# Patient Record
Sex: Female | Born: 1970
Health system: Southern US, Community
[De-identification: ages and names within clinical notes are randomized; demographics above are authoritative.]

## PROBLEM LIST (undated history)

## (undated) DIAGNOSIS — N200 Calculus of kidney: Secondary | ICD-10-CM

## (undated) DIAGNOSIS — R519 Headache, unspecified: Secondary | ICD-10-CM

## (undated) DIAGNOSIS — B019 Varicella without complication: Secondary | ICD-10-CM

## (undated) HISTORY — DX: Varicella without complication: B01.9

## (undated) HISTORY — PX: OTHER SURGICAL HISTORY: SHX169

## (undated) HISTORY — DX: Calculus of kidney: N20.0

## (undated) HISTORY — PX: KIDNEY STONE SURGERY: SHX686

---

## 1999-04-08 ENCOUNTER — Encounter: Payer: Self-pay | Admitting: General Surgery

## 1999-04-08 ENCOUNTER — Ambulatory Visit (HOSPITAL_COMMUNITY): Admission: RE | Admit: 1999-04-08 | Discharge: 1999-04-08 | Payer: Self-pay | Admitting: General Surgery

## 1999-04-16 ENCOUNTER — Ambulatory Visit (HOSPITAL_COMMUNITY): Admission: RE | Admit: 1999-04-16 | Discharge: 1999-04-16 | Payer: Self-pay | Admitting: General Surgery

## 1999-04-16 ENCOUNTER — Encounter: Payer: Self-pay | Admitting: General Surgery

## 1999-04-16 ENCOUNTER — Encounter (INDEPENDENT_AMBULATORY_CARE_PROVIDER_SITE_OTHER): Payer: Self-pay | Admitting: Specialist

## 1999-06-04 ENCOUNTER — Ambulatory Visit (HOSPITAL_COMMUNITY): Admission: RE | Admit: 1999-06-04 | Discharge: 1999-06-04 | Payer: Self-pay | Admitting: Internal Medicine

## 1999-06-04 ENCOUNTER — Encounter: Payer: Self-pay | Admitting: Internal Medicine

## 2000-03-11 ENCOUNTER — Encounter: Payer: Self-pay | Admitting: Dermatology

## 2000-03-11 ENCOUNTER — Ambulatory Visit (HOSPITAL_COMMUNITY): Admission: RE | Admit: 2000-03-11 | Discharge: 2000-03-11 | Payer: Self-pay | Admitting: Dermatology

## 2001-05-24 ENCOUNTER — Encounter: Payer: Self-pay | Admitting: Dermatology

## 2001-05-24 ENCOUNTER — Ambulatory Visit (HOSPITAL_COMMUNITY): Admission: RE | Admit: 2001-05-24 | Discharge: 2001-05-24 | Payer: Self-pay | Admitting: Dermatology

## 2002-08-20 ENCOUNTER — Encounter: Payer: Self-pay | Admitting: Internal Medicine

## 2002-08-20 ENCOUNTER — Ambulatory Visit (HOSPITAL_COMMUNITY): Admission: RE | Admit: 2002-08-20 | Discharge: 2002-08-20 | Payer: Self-pay | Admitting: Internal Medicine

## 2004-05-07 ENCOUNTER — Ambulatory Visit: Payer: Self-pay | Admitting: Internal Medicine

## 2004-05-12 ENCOUNTER — Ambulatory Visit: Payer: Self-pay | Admitting: Internal Medicine

## 2007-02-15 ENCOUNTER — Telehealth (INDEPENDENT_AMBULATORY_CARE_PROVIDER_SITE_OTHER): Payer: Self-pay | Admitting: *Deleted

## 2007-08-23 LAB — CONVERTED CEMR LAB: Pap Smear: NORMAL

## 2007-09-27 ENCOUNTER — Ambulatory Visit (HOSPITAL_COMMUNITY): Admission: RE | Admit: 2007-09-27 | Discharge: 2007-09-27 | Payer: Self-pay | Admitting: Dermatology

## 2008-08-01 ENCOUNTER — Ambulatory Visit: Payer: Self-pay | Admitting: Internal Medicine

## 2008-08-01 LAB — CONVERTED CEMR LAB
ALT: 20 units/L (ref 0–35)
AST: 25 units/L (ref 0–37)
Alkaline Phosphatase: 60 units/L (ref 39–117)
BUN: 14 mg/dL (ref 6–23)
Basophils Absolute: 0 10*3/uL (ref 0.0–0.1)
Bilirubin Urine: NEGATIVE
Bilirubin, Direct: 0.2 mg/dL (ref 0.0–0.3)
Calcium: 8.9 mg/dL (ref 8.4–10.5)
Creatinine, Ser: 0.6 mg/dL (ref 0.4–1.2)
Eosinophils Relative: 3.4 % (ref 0.0–5.0)
GFR calc non Af Amer: 119.18 mL/min (ref >60)
Glucose, Bld: 91 mg/dL (ref 70–99)
HCT: 43.4 % (ref 36.0–46.0)
HDL: 44.5 mg/dL (ref >39.00)
Ketones, ur: NEGATIVE mg/dL
Leukocytes, UA: NEGATIVE
Lymphocytes Relative: 20.2 % (ref 12.0–46.0)
Monocytes Relative: 6.9 % (ref 3.0–12.0)
Neutrophils Relative %: 69.3 % (ref 43.0–77.0)
Platelets: 213 10*3/uL (ref 150.0–400.0)
Potassium: 4.2 meq/L (ref 3.5–5.1)
RDW: 12.8 % (ref 11.5–14.6)
Specific Gravity, Urine: 1.015 (ref 1.000–1.030)
TSH: 2.78 microintl units/mL (ref 0.35–5.50)
Total Bilirubin: 1.2 mg/dL (ref 0.3–1.2)
Total Protein, Urine: NEGATIVE mg/dL
Triglycerides: 63 mg/dL (ref 0.0–149.0)
VLDL: 12.6 mg/dL (ref 0.0–40.0)
WBC: 5.1 10*3/uL (ref 4.5–10.5)
pH: 7 (ref 5.0–8.0)

## 2008-08-03 ENCOUNTER — Ambulatory Visit: Payer: Self-pay | Admitting: Internal Medicine

## 2008-08-03 DIAGNOSIS — F411 Generalized anxiety disorder: Secondary | ICD-10-CM | POA: Insufficient documentation

## 2008-08-03 DIAGNOSIS — K219 Gastro-esophageal reflux disease without esophagitis: Secondary | ICD-10-CM | POA: Insufficient documentation

## 2008-08-03 DIAGNOSIS — R1011 Right upper quadrant pain: Secondary | ICD-10-CM | POA: Insufficient documentation

## 2008-08-03 DIAGNOSIS — Z87442 Personal history of urinary calculi: Secondary | ICD-10-CM | POA: Insufficient documentation

## 2008-08-03 DIAGNOSIS — J309 Allergic rhinitis, unspecified: Secondary | ICD-10-CM | POA: Insufficient documentation

## 2008-08-03 DIAGNOSIS — C439 Malignant melanoma of skin, unspecified: Secondary | ICD-10-CM | POA: Insufficient documentation

## 2008-08-03 DIAGNOSIS — F418 Other specified anxiety disorders: Secondary | ICD-10-CM | POA: Insufficient documentation

## 2008-08-03 DIAGNOSIS — M79609 Pain in unspecified limb: Secondary | ICD-10-CM | POA: Insufficient documentation

## 2008-08-03 DIAGNOSIS — E785 Hyperlipidemia, unspecified: Secondary | ICD-10-CM | POA: Insufficient documentation

## 2008-08-07 ENCOUNTER — Encounter (INDEPENDENT_AMBULATORY_CARE_PROVIDER_SITE_OTHER): Payer: Self-pay | Admitting: *Deleted

## 2008-08-09 ENCOUNTER — Encounter: Admission: RE | Admit: 2008-08-09 | Discharge: 2008-08-09 | Payer: Self-pay | Admitting: Internal Medicine

## 2008-11-02 ENCOUNTER — Ambulatory Visit: Payer: Self-pay | Admitting: Internal Medicine

## 2008-11-02 DIAGNOSIS — J019 Acute sinusitis, unspecified: Secondary | ICD-10-CM | POA: Insufficient documentation

## 2009-04-19 ENCOUNTER — Ambulatory Visit: Payer: Self-pay | Admitting: Internal Medicine

## 2009-04-19 DIAGNOSIS — J3501 Chronic tonsillitis: Secondary | ICD-10-CM | POA: Insufficient documentation

## 2009-06-04 ENCOUNTER — Encounter: Payer: Self-pay | Admitting: Internal Medicine

## 2009-08-14 ENCOUNTER — Ambulatory Visit: Payer: Self-pay | Admitting: Internal Medicine

## 2009-08-14 LAB — CONVERTED CEMR LAB
BUN: 17 mg/dL (ref 6–23)
Basophils Absolute: 0 10*3/uL (ref 0.0–0.1)
Bilirubin Urine: NEGATIVE
Bilirubin, Direct: 0.1 mg/dL (ref 0.0–0.3)
Chloride: 106 meq/L (ref 96–112)
Cholesterol: 174 mg/dL (ref 0–200)
Creatinine, Ser: 0.6 mg/dL (ref 0.4–1.2)
Eosinophils Absolute: 0.2 10*3/uL (ref 0.0–0.7)
Glucose, Bld: 89 mg/dL (ref 70–99)
HCT: 42.7 % (ref 36.0–46.0)
LDL Cholesterol: 121 mg/dL — ABNORMAL HIGH (ref 0–99)
Leukocytes, UA: NEGATIVE
Lymphs Abs: 1.4 10*3/uL (ref 0.7–4.0)
MCV: 89.1 fL (ref 78.0–100.0)
Monocytes Absolute: 0.4 10*3/uL (ref 0.1–1.0)
Neutrophils Relative %: 66 % (ref 43.0–77.0)
Nitrite: NEGATIVE
Platelets: 236 10*3/uL (ref 150.0–400.0)
Potassium: 4.6 meq/L (ref 3.5–5.1)
RDW: 14 % (ref 11.5–14.6)
Specific Gravity, Urine: >=1.03 (ref 1.000–1.030)
TSH: 2.34 microintl units/mL (ref 0.35–5.50)
Total Bilirubin: 1 mg/dL (ref 0.3–1.2)
Triglycerides: 72 mg/dL (ref 0.0–149.0)
VLDL: 14.4 mg/dL (ref 0.0–40.0)
pH: 6 (ref 5.0–8.0)

## 2009-08-19 ENCOUNTER — Ambulatory Visit: Payer: Self-pay | Admitting: Internal Medicine

## 2010-02-05 ENCOUNTER — Ambulatory Visit: Payer: Self-pay | Admitting: Internal Medicine

## 2010-02-05 DIAGNOSIS — E669 Obesity, unspecified: Secondary | ICD-10-CM | POA: Insufficient documentation

## 2010-03-25 NOTE — Assessment & Plan Note (Signed)
Summary: throat issues/#/cd   Vital Signs:  Patient profile:   40 year old female Height:      64 inches Weight:      229.50 pounds BMI:     39.54 O2 Sat:      98 % on Room air Temp:     98.8 degrees F oral Pulse rate:   106 / minute BP sitting:   114 / 68  (left arm) Cuff size:   large  Vitals Entered ByZella Ball Ewing (April 19, 2009 10:05 AM)  O2 Flow:  Room air CC: Sore Throat/RE   CC:  Sore Throat/RE.  History of Present Illness: here with light ST that seems to come and go, now into the third episode of hte past 2 months assoc pain this time with loud voice and higher pitches;  more sneezing in the past wk; has sensation in the back of the thraot to swallow this AM - like a "rubbing" sensation;  no fever, colored d/c, no singif cough, Pt denies CP, sob, doe, wheezing, orthopnea, pnd, worsening LE edema, palps, dizziness or syncope   . No recent fever or increased cough.  Pt denies new neuro symptoms such as headache, facial or extremity weakness No polydipsia, polyuria.  No osa syjmtpoms or hypersomnolence.  Problems Prior to Update: 1)  Tonsillitis, Chronic  (ICD-474.00) 2)  Sinusitis- Acute-nos  (ICD-461.9) 3)  Preventive Health Care  (ICD-V70.0) 4)  Ruq Pain  (ICD-789.01) 5)  Hyperlipidemia  (ICD-272.4) 6)  Melanoma  (ICD-172.9) 7)  Heel Pain, Right  (ICD-729.5) 8)  Nephrolithiasis, Hx of  (ICD-V13.01) 9)  Allergic Rhinitis  (ICD-477.9) 10)  Preventive Health Care  (ICD-V70.0) 11)  Depression  (ICD-311) 12)  Anxiety  (ICD-300.00) 13)  Gerd  (ICD-530.81)  Medications Prior to Update: 1)  Amoxicillin-Pot Clavulanate 875-125 Mg Tabs (Amoxicillin-Pot Clavulanate) .Marland Kitchen.. 1 By Mouth Two Times A Day  Current Medications (verified): 1)  Fexofenadine Hcl 180 Mg Tabs (Fexofenadine Hcl) .Marland Kitchen.. 1 By Mouth Once Daily As Needed Allergies 2)  Fluticasone Propionate 50 Mcg/act Susp (Fluticasone Propionate) .... 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   Eyes:  vision grossly intact, pupils equal, and pupils round.   Ears:  bilat tm's mild red, sinus nontender Nose:  nasal dischargemucosal pallor and mucosal edema.   Mouth:  pharynx with midl cobblestoning;  also tonsil 2+ chronic enlarged and crypts with whitish material Neck:  supple and cervical lymphadenopathy.   Lungs:  normal respiratory effort and normal breath sounds.   Heart:  normal rate and regular rhythm.   Extremities:  no edema, no erythema  Psych:  slightly anxious.     Impression & Recommendations:  Problem # 1:  ALLERGIC RHINITIS (ICD-477.9)  most likely to blame for the recurring ST - for depo shot ; then tx for allergies with allegra adn flonase  Her updated medication list for this  problem includes:    Fexofenadine Hcl 180 Mg Tabs (Fexofenadine hcl) .Marland Kitchen... 1 by mouth once daily as needed allergies    Fluticasone Propionate 50 Mcg/act Susp (Fluticasone propionate) .Marland Kitchen... 2 spray/side once daily  Orders: Depo- Medrol 40mg  (J1030) Depo- Medrol 80mg  (J1040) Admin of Therapeutic Inj  intramuscular or subcutaneous (16109)  Problem # 2:  TONSILLITIS, CHRONIC (ICD-474.00) consider ent, but pt wants to try tx above first  Complete Medication List: 1)  Fexofenadine Hcl 180 Mg Tabs (Fexofenadine hcl)  .Marland Kitchen.. 1 by mouth once daily as needed allergies 2)  Fluticasone Propionate 50 Mcg/act Susp (Fluticasone propionate) .... 2 spray/side once daily  Patient Instructions: 1)  you had the steroid shot today 2)  Please take all new medications as prescribed 3)  Continue all previous medications as before this visit  4)  Please schedule a follow-up appointment in 4 months with CPX labs Prescriptions: FLUTICASONE PROPIONATE 50 MCG/ACT SUSP (FLUTICASONE PROPIONATE) 2 spray/side once daily  #1 x 11   Entered and Authorized by:   Corwin Levins MD   Signed by:   Corwin Levins MD on 04/19/2009   Method used:   Print then Give to Patient   RxID:   [id number] FEXOFENADINE HCL 180 MG TABS (FEXOFENADINE HCL) 1 by mouth once daily as needed allergies  #30 x 11   Entered and Authorized by:   Corwin Levins MD   Signed by:   Corwin Levins MD on 04/19/2009   Method used:   Print then Give to Patient   RxID:   [id number]    Medication Administration  Injection # 1:    Medication: Depo- Medrol 40mg     Diagnosis: ALLERGIC RHINITIS (ICD-477.9)    Route: IM    Site: RUOQ gluteus    Exp Date: 11/2009    Lot #: [id number]    Mfr: Teva    Given by: Zella Ball Ewing (April 19, 2009 10:44 AM)  Injection # 2:    Medication: Depo- Medrol 80mg     Diagnosis: ALLERGIC RHINITIS (ICD-477.9)    Route: IM    Site: RUOQ gluteus    Exp Date: 11/2009    Lot #: [id number]    Mfr: Teva    Given by: Zella Ball Ewing (April 19, 2009 10:44 AM)  Orders Added: 1)  Depo- Medrol 40mg  [J1030] 2)  Depo- Medrol 80mg  [J1040] 3)  Admin of Therapeutic Inj  intramuscular or subcutaneous [96372] 4)  Est. Patient Level IV [25366]

## 2010-03-25 NOTE — Assessment & Plan Note (Signed)
Summary: 4 MTH FU--STC   Vital Signs:  Patient profile:   40 year old female Height:      63 inches Weight:      231.50 pounds BMI:     41.16 O2 Sat:      97 % on Room air Temp:     99 degrees F oral Pulse rate:   87 / minute BP sitting:   104 / 70  (left arm) Cuff size:   large  Vitals Entered By: Zella Ball Ewing CMA (AAMA) (August 19, 2009 8:14 AM)  O2 Flow:  Room air  CC: 4 month followup/RE   CC:  4 month followup/RE.  History of Present Illness: overall doing ok, but has recurreing reflux, and occasional lump in the throat feeling for which she saw ENT;  did not take PPI as recommended.  No other dysphagia, n/v, abd pain, bowel change or blood.    Problems Prior to Update: 1)  Tonsillitis, Chronic  (ICD-474.00) 2)  Sinusitis- Acute-nos  (ICD-461.9) 3)  Preventive Health Care  (ICD-V70.0) 4)  Ruq Pain  (ICD-789.01) 5)  Hyperlipidemia  (ICD-272.4) 6)  Melanoma  (ICD-172.9) 7)  Heel Pain, Right  (ICD-729.5) 8)  Nephrolithiasis, Hx of  (ICD-V13.01) 9)  Allergic Rhinitis  (ICD-477.9) 10)  Preventive Health Care  (ICD-V70.0) 11)  Depression  (ICD-311) 12)  Anxiety  (ICD-300.00) 13)  Gerd  (ICD-530.81)  Medications Prior to Update: 1)  Fexofenadine Hcl 180 Mg Tabs (Fexofenadine Hcl) .Marland Kitchen.. 1 By Mouth Once Daily As Needed Allergies 2)  Fluticasone Propionate 50 Mcg/act Susp (Fluticasone Propionate) .... 2 Spray/side Once Daily  Current Medications (verified): 1)  Fexofenadine Hcl 180 Mg Tabs (Fexofenadine Hcl) .Marland Kitchen.. 1 By Mouth Once Daily As Needed Allergies 2)  Omeprazole 20 Mg Cpdr (Omeprazole) .... 2 By Mouth Once Daily  Allergies (verified): No Known Drug Allergies  Past History:  Past Medical History: Last updated: 08/03/2008 melanoma  - 2001 LLE GERD Anxiety insomnia migraine Depression Allergic rhinitis Nephrolithiasis, hx of Hyperlipidemia  Past Surgical History: Last updated: 08/03/2008 Denies surgical history  Family History: Last updated:  08/03/2008 heart disease - grandmother, as well as DM, lung cancer, TIA mother with gallstones father with basal cell ca, elevated cholesterol maternal grandparents - both with DM grandfather with heart disease  Social History: Last updated: 08/03/2008 Alcohol use-yes - rare Never Smoked Married no children work - at home office, 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         all otherwise negative per pt -    Physical Exam  General:  alert and overweight-appearing.   Head:  normocephalic and atraumatic.   Eyes:  vision grossly intact, pupils equal, and pupils round.   Ears:  R ear normal and L ear normal.   Nose:  no external deformity and no nasal discharge.   Mouth:  no gingival abnormalities and pharynx pink and moist.   Neck:  supple and no masses.   Lungs:  normal respiratory effort and normal breath sounds.   Heart:  normal rate and regular rhythm.   Abdomen:  soft, non-tender, and normal bowel sounds.   Msk:  no joint tenderness  and no joint swelling.   Extremities:  no edema, no erythema  Neurologic:  cranial nerves II-XII intact and strength normal in all extremities.   Skin:  color normal and no rashes.   Psych:  not depressed appearing and slightly anxious.     Impression & Recommendations:  Problem # 1:  Preventive Health Care (ICD-V70.0) Overall doing well, age appropriate education and counseling updated and referral for appropriate preventive services  done unless declined, immunizations up to date or declined, diet counseling done if overweight, urged to quit smoking if smokes , most recent labs reviewed and current ordered if appropriate, ecg reviewed or declined (interpretation per ECG scanned in the EMR if done); information regarding Medicare Prevention requirements given if appropriate; speciality referrals updated as appropriate   Problem # 2:  GERD (ICD-530.81)  Her updated medication list for this problem includes:    Omeprazole 20 Mg Cpdr (Omeprazole) .Marland Kitchen... 2 by mouth once daily treat as above, f/u any worsening signs or symptoms   Problem # 3:  ANXIETY (ICD-300.00) stable overall by hx and exam, ok to continue meds/tx as is   Complete Medication List: 1)  Fexofenadine Hcl 180 Mg Tabs (Fexofenadine hcl) .Marland Kitchen.. 1 by mouth once daily as needed allergies 2)  Omeprazole 20 Mg Cpdr (Omeprazole) .... 2 by mouth once daily  Patient Instructions: 1)  please call for baseline mammogram  - consider [facility] on [address] 2)  Please take all new medications as prescribed 3)  Continue all previous medications as before this visit  4)  please call for referral for GI if still have symptoms swallowing in 4 to 6 wks, or sooner if worse 5)  Please schedule a follow-up appointment in 1 year or sooner if needed Prescriptions: OMEPRAZOLE 20 MG CPDR (OMEPRAZOLE) 2 by mouth once daily  #180 x 3   Entered and Authorized by:   Corwin Levins MD   Signed by:   Corwin Levins MD on 08/19/2009   Method used:   Print then Give to Patient   RxID:   [id number]

## 2010-03-25 NOTE — Consult Note (Signed)
Summary: [facility]  [facility]   Imported By: Lanelle Bal 06/07/2009 11:28:57  _____________________________________________________________________  External Attachment:    Type:   Image     Comment:   External Document

## 2010-03-27 NOTE — Assessment & Plan Note (Signed)
Summary: follow up weight loss-lb   Vital Signs:  Patient profile:   40 year old female Height:      63 inches Weight:      238 pounds BMI:     42.31 O2 Sat:      97 % on Room air Temp:     98.8 degrees F oral Pulse rate:   86 / minute BP sitting:   112 / 70  (left arm) Cuff size:   large  Vitals Entered By: Zella Ball Ewing CMA Duncan Dull) (February 05, 2010 8:09 AM)  O2 Flow:  Room air CC: Weight Loss, congestion/RE   CC:  Weight Loss and congestion/RE.  History of Present Illness: here to f/u; Overall good compliance with meds, and good tolerability, but still with daily breakthrough reflux symtpoms, without n/v, dysphagia, abd pain, bowel change, or blood.  Also with several wks ongoing nasal allergy symtpoms with clearish drainage , post nasal gtt,  slight cough, but no fever, pain, colored d/c, prod cough and Pt denies CP, worsening sob, doe, wheezing, orthopnea, pnd, worsening LE edema, palps, dizziness or syncope  Pt denies new neuro symptoms such as headache, facial or extremity weakness  Pt denies polydipsia, polyuria.   Pt denies polydipsia, polyuria,  trying to follow low chol diet.   Currently out of her ongoing meds. No fever, wt loss, night sweats, loss of appetite or other constitutional symptoms  Denies worsening depressive symptoms, suicidal ideation, or panic., but unable to lose wt, keeps gaining about 10 lbs per yr, now decides she has to make lifestyle change , husband has sleep apnea and she has snoring, but no am headache or daytime somnolence;.    Preventive Screening-Counseling & Management      Drug Use:  no.    Problems Prior to Update: 1)  Obesity  (ICD-278.00) 2)  Tonsillitis, Chronic  (ICD-474.00) 3)  Sinusitis- Acute-nos  (ICD-461.9) 4)  Preventive Health Care  (ICD-V70.0) 5)  Ruq Pain  (ICD-789.01) 6)  Hyperlipidemia  (ICD-272.4) 7)  Melanoma  (ICD-172.9) 8)  Heel Pain, Right  (ICD-729.5) 9)  Nephrolithiasis, Hx of  (ICD-V13.01) 10)  Allergic Rhinitis   (ICD-477.9) 11)  Preventive Health Care  (ICD-V70.0) 12)  Depression  (ICD-311) 13)  Anxiety  (ICD-300.00) 14)  Gerd  (ICD-530.81)  Medications Prior to Update: 1)  Fexofenadine Hcl 180 Mg Tabs (Fexofenadine Hcl) .Marland Kitchen.. 1 By Mouth Once Daily As Needed Allergies 2)  Omeprazole 20 Mg Cpdr (Omeprazole) .... 2 By Mouth Once Daily  Current Medications (verified): 1)  Fexofenadine Hcl 180 Mg Tabs (Fexofenadine Hcl) .Marland Kitchen.. 1 By Mouth Once Daily As Needed Allergies 2)  Dexilant 60 Mg Cpdr (Dexlansoprazole) .Marland Kitchen.. 1 Po Once Daily 3)  Phentermine Hcl 37.5 Mg Caps (Phentermine Hcl) .Marland Kitchen.. 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   Head:  normocephalic and atraumatic.   Eyes:  vision grossly intact, pupils equal, and pupils round.   Ears:  R ear normal and L ear normal.   Nose:  nasal dischargemucosal pallor and mucosal edema.   Mouth:  pharyngeal erythema and fair dentition.   Neck:  supple and no masses.   Lungs:  normal respiratory effort and normal breath sounds.   Heart:  normal rate and regular rhythm.   Abdomen:  soft, non-tender, and normal bowel sounds.   Extremities:   no edema, no erythema    Impression & Recommendations:  Problem # 1:  GERD (ICD-530.81)  Her updated medication list for this problem includes:    Dexilant 60 Mg Cpdr (Dexlansoprazole) .Marland Kitchen... 1 po once daily ? increased pain to omeprazole per pt;  try change to dexilant 60mg  per day  Problem # 2:  HYPERLIPIDEMIA (ICD-272.4)  Labs Reviewed: SGOT: 17 (08/14/2009)   SGPT: 20 (08/14/2009)   HDL:38.30 (08/14/2009), 44.50 (08/01/2008)  LDL:121 (08/14/2009)  Chol:174 (08/14/2009), 201 (08/01/2008)  Trig:72.0 (08/14/2009), 63.0 (08/01/2008) d/w pt - declines statin at this time,  Pt to continue diet efforts, good med tolerance; to check labs next visit- goal LDL less than 100  Problem # 3:  ALLERGIC RHINITIS (ICD-477.9)  Her updated medication list for this problem includes:    Fexofenadine Hcl 180 Mg Tabs (Fexofenadine hcl) .Marland Kitchen... 1 by mouth once daily as needed allergies treat as above, f/u any worsening signs or symptoms   Problem # 4:  OBESITY (ICD-278.00) unable to lose wt;  counseled on increased excercise, lower calories, and limited phentermine rx   Complete Medication List: 1)  Fexofenadine Hcl 180 Mg Tabs (Fexofenadine hcl) .Marland Kitchen.. 1 by mouth once daily as needed allergies 2)  Dexilant 60 Mg Cpdr (Dexlansoprazole) .Marland Kitchen.. 1 po once daily 3)  Phentermine Hcl 37.5 Mg Caps (Phentermine hcl) .Marland Kitchen.. 1 by mouth once daily  Patient Instructions: 1)  Please take all new medications as prescribed 2)  Continue all previous medications as before this visit  3)  Please schedule a follow-up appointment in June 2012 for CPX with labs Prescriptions: PHENTERMINE HCL 37.5 MG CAPS (PHENTERMINE HCL) 1 by mouth once daily  #30 x 2   Entered and Authorized by:   Corwin Levins MD   Signed by:   Corwin Levins MD on 02/05/2010   Method used:   Print then Give to Patient   RxID:   [id number] FEXOFENADINE HCL 180 MG TABS (FEXOFENADINE HCL) 1 by mouth once daily as needed allergies  #30 x 11   Entered  and Authorized by:   Corwin Levins MD   Signed by:   Corwin Levins MD on 02/05/2010   Method used:   Print then Give to Patient   RxID:   [id number] DEXILANT 60 MG CPDR (DEXLANSOPRAZOLE) 1 po once daily  #30 x 11   Entered and Authorized by:   Corwin Levins MD   Signed by:   Corwin Levins MD on 02/05/2010   Method used:   Print then Give to Patient   RxID:   [id number]    Orders Added: 1)  Est. Patient Level IV [47425]

## 2010-07-11 NOTE — Op Note (Signed)
[place]. [facility]  Patient:    Darlene Lopez, Darlene Lopez                      MRN: [id number] Proc. Date: 04/16/99 Adm. Date:  [id number] Disc. Date: [id number] Attending:  Janalyn Rouse                           Operative Report  PREOPERATIVE DIAGNOSIS:  Superficial spreading melanoma of the left anterior thigh.  POSTOPERATIVE DIAGNOSIS:  Superficial spreading melanoma of the left anterior thigh.  OPERATION PERFORMED: 1. Injection of blue dye. 2. Lymphatic mapping of the left leg. 3. Left inguinal sentinel lymph node biopsy. 4. Wide excision of melanoma of the left anterior thigh with layered closure.  SURGEON:  Rose Phi. Maple Hudson, M.D.  ANESTHESIA:  General.  DESCRIPTION OF PROCEDURE:  After suitable general anesthesia was induced, the patient was placed in supine position.  I then injected, intradermally, Lymphazurin blue around the previous melanoma excisional biopsy site.  I then prepped the thigh and groin and then draped this out.  With a hand held gamma probe, we carefully mapped the inguinal area and identified, right at the  level of the inguinal crease, a very hot spot and then just below that another ot spot.  A vertical incision was then made over these areas and we dissected through the  subcutaneous tissues, inserted a self-retaining retractor.  I was then able to identify the very hot spot and carefully dissected out a very blue and quite hot lymph node and we clipped the lymphatics and removed it.  Just inferior to that was another bluish and warmish lymph node which we excised also.  These were submitted as sentinel lymph nodes.  Careful scanning of the rest of the inguinal area revealed no other hot spots.  This incision was closed in layers with 3-0 Vicryl and then interrupted 4-0 nylons for the skin.  On the previous incision site on the left anterior thigh, I marked a 1.5 cm margin around the old scar.  We then excised  this down to the anterior fascia. Hemostasis obtained with the cautery.  We then closed the incision in layers with 3-0 Vicryl and 4-0 nylon for the skin.  Dressing applied.  The patient was transferred to he recovery room in satisfactory condition having tolerated the procedure well. DD:  04/16/99 TD:  04/16/99 Job: [id number]

## 2014-04-04 ENCOUNTER — Encounter: Payer: Self-pay | Admitting: Family

## 2014-04-04 ENCOUNTER — Ambulatory Visit (INDEPENDENT_AMBULATORY_CARE_PROVIDER_SITE_OTHER): Payer: BLUE CROSS/BLUE SHIELD | Admitting: Family

## 2014-04-04 VITALS — BP 144/90 | HR 109 | Temp 98.2°F | Resp 18 | Ht 63.0 in | Wt 249.1 lb

## 2014-04-04 DIAGNOSIS — R21 Rash and other nonspecific skin eruption: Secondary | ICD-10-CM

## 2014-04-04 MED ORDER — TRIAMCINOLONE ACETONIDE 0.1 % EX CREA: 1.0000 "application " | TOPICAL_CREAM | Freq: Two times a day (BID) | CUTANEOUS | Status: DC

## 2014-04-04 MED ORDER — VALACYCLOVIR HCL 1 G PO TABS: 1000.0000 mg | ORAL_TABLET | Freq: Three times a day (TID) | ORAL | Status: DC

## 2014-04-04 NOTE — Assessment & Plan Note (Signed)
Pattern of rash consistent with early shingles, however cannot rule out allergic reaction. Start Valtrex. Start triamcinolone cream as needed for itching. Apply cool compresses as needed. Follow up if symptoms worsen or fail to improve.

## 2014-04-04 NOTE — Patient Instructions (Signed)
Thank you for choosing [facility].  Summary/Instructions:  Your prescription(s) have been submitted to your pharmacy or been printed and provided for you. Please take as directed and contact our office if you believe you are having problem(s) with the medication(s) or have any questions.  If your symptoms worsen or fail to improve, please contact our office for further instruction, or in case of emergency go directly to the emergency room at the closest medical facility.   Shingles Shingles (herpes zoster) is an infection that is caused by the same virus that causes chickenpox (varicella). The infection causes a painful skin rash and fluid-filled blisters, which eventually break open, crust over, and heal. It may occur in any area of the body, but it usually affects only one side of the body or face. The pain of shingles usually lasts about 1 month. However, some people with shingles may develop long-term (chronic) pain in the affected area of the body. Shingles often occurs many years after the person had chickenpox. It is more common:  In people older than 50 years.  In people with weakened immune systems, such as those with HIV, AIDS, or cancer.  In people taking medicines that weaken the immune system, such as transplant medicines.  In people under great stress. CAUSES  Shingles is caused by the varicella zoster virus (VZV), which also causes chickenpox. After a person is infected with the virus, it can remain in the person's body for years in an inactive state (dormant). To cause shingles, the virus reactivates and breaks out as an infection in a nerve root. The virus can be spread from person to person (contagious) through contact with open blisters of the shingles rash. It will only spread to people who have not had chickenpox. When these people are exposed to the virus, they may develop chickenpox. They will not develop shingles. Once the blisters scab over, the person is no  longer contagious and cannot spread the virus to others. SIGNS AND SYMPTOMS  Shingles shows up in stages. The initial symptoms may be pain, itching, and tingling in an area of the skin. This pain is usually described as burning, stabbing, or throbbing.In a few days or weeks, a painful red rash will appear in the area where the pain, itching, and tingling were felt. The rash is usually on one side of the body in a band or belt-like pattern. Then, the rash usually turns into fluid-filled blisters. They will scab over and dry up in approximately 2-3 weeks. Flu-like symptoms may also occur with the initial symptoms, the rash, or the blisters. These may include:  Fever.  Chills.  Headache.  Upset stomach. DIAGNOSIS  Your health care provider will perform a skin exam to diagnose shingles. Skin scrapings or fluid samples may also be taken from the blisters. This sample will be examined under a microscope or sent to a lab for further testing. TREATMENT  There is no specific cure for shingles. Your health care provider will likely prescribe medicines to help you manage the pain, recover faster, and avoid long-term problems. This may include antiviral drugs, anti-inflammatory drugs, and pain medicines. HOME CARE INSTRUCTIONS   Take a cool bath or apply cool compresses to the area of the rash or blisters as directed. This may help with the pain and itching.   Take medicines only as directed by your health care provider.   Rest as directed by your health care provider.  Keep your rash and blisters clean with mild soap and  cool water or as directed by your health care provider.  Do not pick your blisters or scratch your rash. Apply an anti-itch cream or numbing creams to the affected area as directed by your health care provider.  Keep your shingles rash covered with a loose bandage (dressing).  Avoid skin contact with:  Babies.   Pregnant women.   Children with eczema.   Elderly  people with transplants.   People with chronic illnesses, such as leukemia or AIDS.   Wear loose-fitting clothing to help ease the pain of material rubbing against the rash.  Keep all follow-up visits as directed by your health care provider.If the area involved is on your face, you may receive a referral for a specialist, such as an eye doctor (ophthalmologist) or an ear, nose, and throat (ENT) doctor. Keeping all follow-up visits will help you avoid eye problems, chronic pain, or disability.  SEEK IMMEDIATE MEDICAL CARE IF:   You have facial pain, pain around the eye area, or loss of feeling on one side of your face.  You have ear pain or ringing in your ear.  You have loss of taste.  Your pain is not relieved with prescribed medicines.   Your redness or swelling spreads.   You have more pain and swelling.  Your condition is worsening or has changed.   You have a fever. MAKE SURE YOU:  Understand these instructions.  Will watch your condition.  Will get help right away if you are not doing well or get worse. Document Released: 02/09/2005 Document Revised: 06/26/2013 Document Reviewed: 09/24/2011 [facility] Patient Information 2015 [place], [place]. This information is not intended to replace advice given to you by your health care provider. Make sure you discuss any questions you have with your health care provider.

## 2014-04-04 NOTE — Progress Notes (Signed)
   Subjective:    Patient ID: Darlene Lopez, female    DOB: 1970/09/25, 44 y.o.   MRN: [id number]  Chief Complaint  Patient presents with  . Rash    x2 days, itching, no pain, red     HPI:  Darlene Lopez is a 44 y.o. female who presents today for an acute visit.   This is a new problem. Acute symptom of red, itchy rash located on her right flank has been going on for 2 days. Has tried hydrocortisone cream and tea tree oil which provided minimal relief. Intensity of pain is 0/10. Denies any changes to creams, lotions, soaps, or beauty/skin products.   No Known Allergies  No current outpatient prescriptions on file prior to visit.   No current facility-administered medications on file prior to visit.    Past Medical History  Diagnosis Date  . Chicken pox   . Kidney stones     Past Surgical History  Procedure Laterality Date  . Melanoma removal      Family History  Problem Relation Age of Onset  . Prostate cancer Father   . Lung cancer Paternal Grandmother     History   Social History  . Marital Status: Married    Spouse Name: N/A  . Number of Children: 0  . Years of Education: 12   Occupational History  . Secretary    Social History Main Topics  . Smoking status: Never Smoker   . Smokeless tobacco: Never Used  . Alcohol Use: No  . Drug Use: No  . Sexual Activity: Not on file   Other Topics Concern  . Not on file   Social History Narrative  . No narrative on file    Review of Systems  Constitutional: Negative for fever and chills.  Skin: Positive for rash.      Objective:    BP 144/90 mmHg  Pulse 109  Temp(Src) 98.2 F (36.8 C) (Oral)  Resp 18  Ht 5\' 3"  (1.6 m)  Wt 249 lb 1.9 oz (113 kg)  BMI 44.14 kg/m2  SpO2 97% Nursing note and vital signs reviewed.  Physical Exam  Constitutional: She is oriented to person, place, and time. She appears well-developed and well-nourished. No distress.  Cardiovascular: Normal rate, regular  rhythm, normal heart sounds and intact distal pulses.   Pulmonary/Chest: Effort normal and breath sounds normal.  Neurological: She is alert and oriented to person, place, and time.  Skin: Skin is warm and dry. Rash noted. Rash is maculopapular (following dermatomal pattern, appears red). Rash is not vesicular.  Psychiatric: She has a normal mood and affect. Her behavior is normal. Judgment and thought content normal.       Assessment & Plan:

## 2014-04-04 NOTE — Progress Notes (Signed)
Pre visit review using our clinic review tool, if applicable. No additional management support is needed unless otherwise documented below in the visit note. 

## 2016-03-20 DIAGNOSIS — J111 Influenza due to unidentified influenza virus with other respiratory manifestations: Secondary | ICD-10-CM | POA: Diagnosis not present

## 2016-10-02 ENCOUNTER — Telehealth: Payer: Self-pay | Admitting: Internal Medicine

## 2016-10-02 DIAGNOSIS — R55 Syncope and collapse: Secondary | ICD-10-CM | POA: Diagnosis not present

## 2016-10-02 NOTE — Telephone Encounter (Signed)
Ok with me 

## 2016-10-02 NOTE — Telephone Encounter (Signed)
Patient has not been seen by Dr. Jenny Reichmann since 2010.  Would like to know if she can establish again.

## 2016-10-05 NOTE — Telephone Encounter (Signed)
I have scheduled patient.  

## 2016-10-08 ENCOUNTER — Encounter: Payer: Self-pay | Admitting: Internal Medicine

## 2016-10-08 ENCOUNTER — Ambulatory Visit (INDEPENDENT_AMBULATORY_CARE_PROVIDER_SITE_OTHER): Payer: BLUE CROSS/BLUE SHIELD | Admitting: Internal Medicine

## 2016-10-08 ENCOUNTER — Other Ambulatory Visit (INDEPENDENT_AMBULATORY_CARE_PROVIDER_SITE_OTHER): Payer: BLUE CROSS/BLUE SHIELD

## 2016-10-08 VITALS — BP 116/82 | HR 83 | Temp 98.4°F | Ht 63.0 in | Wt 246.0 lb

## 2016-10-08 DIAGNOSIS — R739 Hyperglycemia, unspecified: Secondary | ICD-10-CM | POA: Diagnosis not present

## 2016-10-08 DIAGNOSIS — Z0000 Encounter for general adult medical examination without abnormal findings: Secondary | ICD-10-CM | POA: Diagnosis not present

## 2016-10-08 DIAGNOSIS — Z0001 Encounter for general adult medical examination with abnormal findings: Secondary | ICD-10-CM | POA: Insufficient documentation

## 2016-10-08 DIAGNOSIS — Z114 Encounter for screening for human immunodeficiency virus [HIV]: Secondary | ICD-10-CM | POA: Diagnosis not present

## 2016-10-08 DIAGNOSIS — Z23 Encounter for immunization: Secondary | ICD-10-CM

## 2016-10-08 LAB — BASIC METABOLIC PANEL
BUN: 13 mg/dL (ref 6–23)
CHLORIDE: 102 meq/L (ref 96–112)
CO2: 29 meq/L (ref 19–32)
Calcium: 9.4 mg/dL (ref 8.4–10.5)
Creatinine, Ser: 0.59 mg/dL (ref 0.40–1.20)
GFR: 116.75 mL/min (ref >60.00)
Glucose, Bld: 96 mg/dL (ref 70–99)
POTASSIUM: 4.3 meq/L (ref 3.5–5.1)
SODIUM: 139 meq/L (ref 135–145)

## 2016-10-08 LAB — LIPID PANEL
CHOLESTEROL: 182 mg/dL (ref 0–200)
HDL: 45.3 mg/dL (ref >39.00)
LDL Cholesterol: 120 mg/dL — ABNORMAL HIGH (ref 0–99)
NONHDL: 136.97
Total CHOL/HDL Ratio: 4
Triglycerides: 85 mg/dL (ref 0.0–149.0)
VLDL: 17 mg/dL (ref 0.0–40.0)

## 2016-10-08 LAB — CBC WITH DIFFERENTIAL/PLATELET
BASOS ABS: 0.1 10*3/uL (ref 0.0–0.1)
BASOS PCT: 1.1 % (ref 0.0–3.0)
EOS PCT: 1.8 % (ref 0.0–5.0)
Eosinophils Absolute: 0.2 10*3/uL (ref 0.0–0.7)
HCT: 46.3 % — ABNORMAL HIGH (ref 36.0–46.0)
HEMOGLOBIN: 15.2 g/dL — AB (ref 12.0–15.0)
LYMPHS PCT: 18.3 % (ref 12.0–46.0)
Lymphs Abs: 1.5 10*3/uL (ref 0.7–4.0)
MCHC: 32.8 g/dL (ref 30.0–36.0)
MCV: 89.4 fl (ref 78.0–100.0)
Monocytes Absolute: 0.6 10*3/uL (ref 0.1–1.0)
Monocytes Relative: 6.5 % (ref 3.0–12.0)
Neutro Abs: 6.1 10*3/uL (ref 1.4–7.7)
Neutrophils Relative %: 72.3 % (ref 43.0–77.0)
Platelets: 254 10*3/uL (ref 150.0–400.0)
RBC: 5.18 Mil/uL — AB (ref 3.87–5.11)
RDW: 14.3 % (ref 11.5–15.5)
WBC: 8.5 10*3/uL (ref 4.0–10.5)

## 2016-10-08 LAB — URINALYSIS, ROUTINE W REFLEX MICROSCOPIC
BILIRUBIN URINE: NEGATIVE
HGB URINE DIPSTICK: NEGATIVE
Ketones, ur: NEGATIVE
LEUKOCYTES UA: NEGATIVE
NITRITE: NEGATIVE
RBC / HPF: NONE SEEN (ref 0–?)
Specific Gravity, Urine: <=1.005 — AB (ref 1.000–1.030)
Total Protein, Urine: NEGATIVE
URINE GLUCOSE: NEGATIVE
Urobilinogen, UA: 0.2 (ref 0.0–1.0)
WBC UA: NONE SEEN (ref 0–?)
pH: 6 (ref 5.0–8.0)

## 2016-10-08 LAB — HEPATIC FUNCTION PANEL
ALT: 20 U/L (ref 0–35)
AST: 17 U/L (ref 0–37)
Albumin: 4.1 g/dL (ref 3.5–5.2)
Alkaline Phosphatase: 54 U/L (ref 39–117)
BILIRUBIN TOTAL: 1.1 mg/dL (ref 0.2–1.2)
Bilirubin, Direct: 0.2 mg/dL (ref 0.0–0.3)
Total Protein: 6.9 g/dL (ref 6.0–8.3)

## 2016-10-08 LAB — TSH: TSH: 3.24 u[IU]/mL (ref 0.35–4.50)

## 2016-10-08 LAB — HEMOGLOBIN A1C: HEMOGLOBIN A1C: 5.6 % (ref 4.6–6.5)

## 2016-10-08 NOTE — Assessment & Plan Note (Signed)

## 2016-10-08 NOTE — Patient Instructions (Addendum)

## 2016-10-08 NOTE — Progress Notes (Signed)
Subjective:    Patient ID: Darlene Lopez, female    DOB: 1970-04-13, 46 y.o.   MRN: [id number]  HPI  Here for wellness and f/u;  Overall doing ok;  Pt denies Chest pain, worsening SOB, DOE, wheezing, orthopnea, PND, worsening LE edema, palpitations,  or syncope.  Pt denies neurological change such as new headache, facial or extremity weakness.  Pt denies polydipsia, polyuria, or low sugar symptoms. Pt states overall good compliance with treatment and medications, good tolerability, and has been trying to follow appropriate diet.  Pt denies worsening depressive symptoms, suicidal ideation or panic. Marland Kitchen  Pt states good ability with ADL's, has low fall risk, home safety reviewed and adequate, no other significant changes in hearing or vision, and only occasionally active with exercise.  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  Had to pull over driving one time, husband took her to UC but ECG and lab work OK per pt.   Pt denies fever, wt loss, night sweats, loss of appetite, or other constitutional symptoms Past Medical History:  Diagnosis Date  . Chicken pox   . Kidney stones    Past Surgical History:  Procedure Laterality Date  . melanoma removal      reports that she has never smoked. She has never used smokeless tobacco. She reports that she does not drink alcohol or use drugs. family history includes Lung cancer in her paternal grandmother; Prostate cancer in her father. No Known Allergies Current Outpatient Prescriptions on File Prior to Visit  Medication Sig Dispense Refill  . triamcinolone cream (KENALOG) 0.1 % Apply 1 application topically 2 (two) times daily. 30 g 0  . valACYclovir (VALTREX) 1000 MG tablet Take 1 tablet (1,000 mg total) by mouth 3 (three) times daily. 21 tablet 0   No current facility-administered  medications on file prior to visit.    Review of Systems  Constitutional: Negative for other unusual diaphoresis, sweats, appetite or weight changes HENT: Negative for other worsening hearing loss, ear pain, facial swelling, mouth sores or neck stiffness.   Eyes: Negative for other worsening pain, redness or other visual disturbance.  Respiratory: Negative for other stridor or swelling Cardiovascular: Negative for other palpitations or other chest pain  Gastrointestinal: Negative for worsening diarrhea or loose stools, blood in stool, distention or other pain Genitourinary: Negative for hematuria, flank pain or other change in urine volume.  Musculoskeletal: Negative for myalgias or other joint swelling.  Skin: Negative for other color change, or other wound or worsening drainage.  Neurological: Negative for other syncope or numbness. Hematological: Negative for other adenopathy or swelling Psychiatric/Behavioral: Negative for hallucinations, other worsening agitation, SI, self-injury, or new decreased concentration All other system neg per pt    Objective:   Physical Exam BP 116/82   Pulse 83   Temp 98.4 F (36.9 C)   Ht 5\' 3"  (1.6 m)   Wt 246 lb (111.6 kg)   SpO2 97%   BMI 43.58 kg/m  VS noted,  Constitutional: Pt is oriented to person, place, and time. Appears well-developed and well-nourished, in no significant distress and comfortable Head: Normocephalic and atraumatic  Eyes: Conjunctivae and EOM are normal. Pupils are equal, round, and reactive to light Right Ear: External ear normal without discharge Left Ear: External ear normal without discharge Nose: Nose without discharge or deformity Mouth/Throat:  Oropharynx is without other ulcerations and moist  Neck: Normal range of motion. Neck supple. No JVD present. No tracheal deviation present or significant neck LA or mass Cardiovascular: Normal rate, regular rhythm, normal heart sounds and intact distal pulses.     Pulmonary/Chest: WOB normal and breath sounds without rales or wheezing  Abdominal: Soft. Bowel sounds are normal. NT. No HSM  Musculoskeletal: Normal range of motion. Exhibits no edema Lymphadenopathy: Has no other cervical adenopathy.  Neurological: Pt is alert and oriented to person, place, and time. Pt has normal reflexes. No cranial nerve deficit. Motor grossly intact, Gait intact Skin: Skin is warm and dry. No rash noted or new ulcerations Psychiatric:  Has normal mood and affect. Behavior is normal without agitation No other exam findings    Assessment & Plan:

## 2016-10-09 LAB — HIV ANTIBODY (ROUTINE TESTING W REFLEX): HIV: NONREACTIVE

## 2016-10-20 DIAGNOSIS — H04123 Dry eye syndrome of bilateral lacrimal glands: Secondary | ICD-10-CM | POA: Diagnosis not present

## 2016-10-20 DIAGNOSIS — H524 Presbyopia: Secondary | ICD-10-CM | POA: Diagnosis not present

## 2016-10-20 DIAGNOSIS — H5319 Other subjective visual disturbances: Secondary | ICD-10-CM | POA: Diagnosis not present

## 2016-11-14 DIAGNOSIS — J0100 Acute maxillary sinusitis, unspecified: Secondary | ICD-10-CM | POA: Diagnosis not present

## 2016-12-14 DIAGNOSIS — Z1231 Encounter for screening mammogram for malignant neoplasm of breast: Secondary | ICD-10-CM | POA: Diagnosis not present

## 2016-12-28 DIAGNOSIS — F321 Major depressive disorder, single episode, moderate: Secondary | ICD-10-CM | POA: Diagnosis not present

## 2016-12-28 DIAGNOSIS — F419 Anxiety disorder, unspecified: Secondary | ICD-10-CM | POA: Diagnosis not present

## 2017-02-02 DIAGNOSIS — G43009 Migraine without aura, not intractable, without status migrainosus: Secondary | ICD-10-CM | POA: Insufficient documentation

## 2017-02-02 DIAGNOSIS — Z01419 Encounter for gynecological examination (general) (routine) without abnormal findings: Secondary | ICD-10-CM | POA: Diagnosis not present

## 2017-02-02 DIAGNOSIS — Z3009 Encounter for other general counseling and advice on contraception: Secondary | ICD-10-CM | POA: Diagnosis not present

## 2017-02-02 DIAGNOSIS — Z1151 Encounter for screening for human papillomavirus (HPV): Secondary | ICD-10-CM | POA: Diagnosis not present

## 2017-03-29 ENCOUNTER — Ambulatory Visit: Payer: BLUE CROSS/BLUE SHIELD | Admitting: Internal Medicine

## 2017-03-29 VITALS — BP 132/84 | HR 100 | Temp 98.4°F | Ht 63.0 in | Wt 241.0 lb

## 2017-03-29 DIAGNOSIS — R51 Headache: Secondary | ICD-10-CM | POA: Diagnosis not present

## 2017-03-29 DIAGNOSIS — H9202 Otalgia, left ear: Secondary | ICD-10-CM | POA: Diagnosis not present

## 2017-03-29 DIAGNOSIS — F418 Other specified anxiety disorders: Secondary | ICD-10-CM | POA: Diagnosis not present

## 2017-03-29 DIAGNOSIS — R519 Headache, unspecified: Secondary | ICD-10-CM | POA: Insufficient documentation

## 2017-03-29 MED ORDER — SUMATRIPTAN SUCCINATE 100 MG PO TABS: 100.0000 mg | ORAL_TABLET | ORAL | 5 refills | Status: DC | PRN

## 2017-03-29 NOTE — Assessment & Plan Note (Addendum)
Assoc with dizziness and transient vision changes, for Head MRI  Note:  Total time for pt hx, exam, review of record with pt in the room, determination of diagnoses and plan for further eval and tx is > 40 min, with over 50% spent in coordination and counseling of patient including the differential dx, tx, further evaluation and other management of HA, dizziness, transient vision changes, depression and anxiety, and left ear pain

## 2017-03-29 NOTE — Assessment & Plan Note (Signed)
Ok for mucinex otc prn,  to f/u any worsening symptoms or concerns 

## 2017-03-29 NOTE — Assessment & Plan Note (Signed)
Declines strongly suggested SSRI such as lexapro, declines counseling or psychiatry referral,

## 2017-03-29 NOTE — Progress Notes (Signed)
   Subjective:    Patient ID: Darlene Lopez, female    DOB: Apr 07, 1970, 47 y.o.   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  Also problem with sleep, moodiness, irritability, and even agitation, husband asked her to come for this today.  Mentions some "skin changes", headaches, dizzy and vision changes to where she feels she cannot drive at night after an episode a few months ago where her vision blurred and had to pull off the road.  Has seen optho with normal exam, needs reading glasses only and no other eye disorders.  Has more freq HA top of head intemritent mod to severe throbbing, no vomiting.  Has seen GYN with changes recently in menses now less than 2 days (short for her) but exam ok.    Pt denies fever, wt loss, night sweats, loss of appetite, or other constitutional symptoms  Also has fullness of left ear and ringing/roaring at times. Past Medical History:  Diagnosis Date  . Chicken pox   . Kidney stones    Past Surgical History:  Procedure Laterality Date  . melanoma removal      reports that  has never smoked. she has never used smokeless tobacco. She reports that she does not drink alcohol or use drugs. family history includes Lung cancer in her paternal grandmother; Prostate cancer in her father. No Known Allergies Current Outpatient Medications on File Prior to Visit  Medication Sig Dispense Refill  . triamcinolone cream (KENALOG) 0.1 % Apply 1 application topically 2 (two) times daily. 30 g 0   No current facility-administered medications on file prior to visit.    Review of Systems  Constitutional: Negative for other unusual diaphoresis or sweats HENT: Negative for ear discharge or swelling Eyes: Negative for other worsening visual disturbances Respiratory:  Negative for stridor or other swelling  Gastrointestinal: Negative for worsening distension or other blood Genitourinary: Negative for retention or other urinary change Musculoskeletal: Negative for other MSK pain or swelling Skin: Negative for color change or other new lesions Neurological: Negative for worsening tremors and other numbness  Psychiatric/Behavioral: Negative for worsening agitation or other fatigue All other system neg per pt    Objective:   Physical Exam BP 132/84   Pulse 100   Temp 98.4 F (36.9 C) (Oral)   Ht 5\' 3"  (1.6 m)   Wt 241 lb (109.3 kg)   SpO2 97%   BMI 42.69 kg/m  VS noted,  Constitutional: Pt appears in NAD HENT: Head: NCAT.  Right Ear: External ear normal.  Left Ear: External ear normal. , left TM with mild erythema and post effusion Eyes: . Pupils are equal, round, and reactive to light. Conjunctivae and EOM are normal Nose: without d/c or deformity Neck: Neck supple. Gross normal ROM Cardiovascular: Normal rate and regular rhythm.   Pulmonary/Chest: Effort normal and breath sounds without rales or wheezing.  Abd:  Soft, NT, ND, + BS, no organomegaly Neurological: Pt is alert. At baseline orientation, motor 5/5 intact, cn 2-12 intact Skin: Skin is warm. No rashes, other new lesions, no LE edema Psychiatric: Pt behavior is normal without agitation, but 2+ stressed, depressed affect  No other exam findings    Assessment & Plan:

## 2017-03-29 NOTE — Patient Instructions (Signed)
You will be contacted regarding the referral for: MRI   Please also take Mucinex OTC twice per day for at least 2 weeks, or as long as needed  Please take all new medication as prescribed - the imitrex for Headache  Please call if you change your mind about starting the Celexa 20 mg daily  Please continue all other medications as before, and refills have been done if requested.  Please have the pharmacy call with any other refills you may need.  Please keep your appointments with your specialists as you may have planned

## 2017-04-11 ENCOUNTER — Ambulatory Visit
Admission: RE | Admit: 2017-04-11 | Discharge: 2017-04-11 | Disposition: A | Discharge status: Home / Self Care | Payer: BLUE CROSS/BLUE SHIELD | Source: Ambulatory Visit | Attending: Internal Medicine | Admitting: Internal Medicine

## 2017-04-11 DIAGNOSIS — R51 Headache: Principal | ICD-10-CM

## 2017-04-11 DIAGNOSIS — R42 Dizziness and giddiness: Secondary | ICD-10-CM | POA: Diagnosis not present

## 2017-04-11 DIAGNOSIS — R519 Headache, unspecified: Secondary | ICD-10-CM

## 2017-10-06 ENCOUNTER — Encounter: Payer: BLUE CROSS/BLUE SHIELD | Admitting: Internal Medicine

## 2018-03-24 DIAGNOSIS — Z8742 Personal history of other diseases of the female genital tract: Secondary | ICD-10-CM | POA: Diagnosis not present

## 2018-03-24 DIAGNOSIS — R1011 Right upper quadrant pain: Secondary | ICD-10-CM | POA: Diagnosis not present

## 2018-06-15 DIAGNOSIS — H1011 Acute atopic conjunctivitis, right eye: Secondary | ICD-10-CM | POA: Diagnosis not present

## 2018-06-15 DIAGNOSIS — J0100 Acute maxillary sinusitis, unspecified: Secondary | ICD-10-CM | POA: Diagnosis not present

## 2018-09-14 DIAGNOSIS — L723 Sebaceous cyst: Secondary | ICD-10-CM | POA: Diagnosis not present

## 2018-10-21 DIAGNOSIS — L723 Sebaceous cyst: Secondary | ICD-10-CM | POA: Diagnosis not present

## 2018-10-21 DIAGNOSIS — L989 Disorder of the skin and subcutaneous tissue, unspecified: Secondary | ICD-10-CM | POA: Diagnosis not present

## 2018-10-21 DIAGNOSIS — L720 Epidermal cyst: Secondary | ICD-10-CM | POA: Diagnosis not present

## 2018-11-29 DIAGNOSIS — R509 Fever, unspecified: Secondary | ICD-10-CM | POA: Diagnosis not present

## 2018-11-29 DIAGNOSIS — R05 Cough: Secondary | ICD-10-CM | POA: Diagnosis not present

## 2018-11-29 DIAGNOSIS — J0100 Acute maxillary sinusitis, unspecified: Secondary | ICD-10-CM | POA: Diagnosis not present

## 2018-11-29 DIAGNOSIS — Z20828 Contact with and (suspected) exposure to other viral communicable diseases: Secondary | ICD-10-CM | POA: Diagnosis not present

## 2018-12-02 ENCOUNTER — Telehealth (HOSPITAL_COMMUNITY): Payer: Self-pay | Admitting: Emergency Medicine

## 2018-12-02 ENCOUNTER — Encounter: Payer: Self-pay | Admitting: Family

## 2018-12-02 ENCOUNTER — Ambulatory Visit (INDEPENDENT_AMBULATORY_CARE_PROVIDER_SITE_OTHER): Payer: BC Managed Care – PPO | Admitting: Family

## 2018-12-02 DIAGNOSIS — U071 COVID-19: Secondary | ICD-10-CM

## 2018-12-02 DIAGNOSIS — M549 Dorsalgia, unspecified: Secondary | ICD-10-CM

## 2018-12-02 NOTE — Progress Notes (Signed)
  Darlene Lopez is a 48 y.o. female with the following history as recorded in [employer]:  Patient Active Problem List   Diagnosis Date Noted  . Nonintractable episodic headache 03/29/2017  . Left ear pain 03/29/2017  . Preventative health care 10/08/2016  . Rash and nonspecific skin eruption 04/04/2014  . OBESITY 02/05/2010  . TONSILLITIS, CHRONIC 04/19/2009  . SINUSITIS- ACUTE-NOS 11/02/2008  . MELANOMA 08/03/2008  . HYPERLIPIDEMIA 08/03/2008  . ANXIETY 08/03/2008  . Depression with anxiety 08/03/2008  . ALLERGIC RHINITIS 08/03/2008  . GERD 08/03/2008  . HEEL PAIN, RIGHT 08/03/2008  . RUQ PAIN 08/03/2008  . NEPHROLITHIASIS, HX OF 08/03/2008    Current Outpatient Medications  Medication Sig Dispense Refill  . SUMAtriptan (IMITREX) 100 MG tablet Take 1 tablet (100 mg total) by mouth every 2 (two) hours as needed for migraine or headache. May repeat in 2 hours if headache persists or recurs. 10 tablet 5  . triamcinolone cream (KENALOG) 0.1 % Apply 1 application topically 2 (two) times daily. 30 g 0   No current facility-administered medications for this visit.     Allergies: Patient has no known allergies.  Past Medical History:  Diagnosis Date  . Chicken pox   . Kidney stones     Past Surgical History:  Procedure Laterality Date  . melanoma removal      Family History  Problem Relation Age of Onset  . Prostate cancer Father   . Lung cancer Paternal Grandmother     Social History   Tobacco Use  . Smoking status: Never Smoker  . Smokeless tobacco: Never Used  Substance Use Topics  . Alcohol use: No    Alcohol/week: 0.0 standard drinks    Subjective:   I connected with Darlene Lopez on 12/02/18 at 10:20 AM EDT by a video enabled telemedicine application and verified that I am speaking with the correct person using two identifiers. Provider is in office; patient is at home; patient and I are the only 2 people on the video call.    I discussed the limitations  of evaluation and management by telemedicine and the availability of in person appointments. The patient expressed understanding and agreed to proceed.  Patient notes she tested positive for COVID 2 days ago; very concerned about severity of mid-back pain she has been experiencing since Wednesday evening; notes she is prone to kidney stones but wonders if this type of isolated pain could be COVID specific; denies any shortness of breath or high fever; has lost sense of taste and smell;     Objective:  There were no vitals filed for this visit.  General: Well developed, well nourished, in no acute distress  Skin : Warm and dry.  Head: Normocephalic and atraumatic  Lungs: Respirations unlabored;  Neurologic: Alert and oriented; speech intact; face symmetrical;   Assessment:  1. COVID-19 virus infection   2. Mid back pain     Plan:  Recommending that patient be seen at [facility] or ER for further evaluation. Follow-up worse, no better.   No follow-ups on file.  No orders of the defined types were placed in this encounter.   Requested Prescriptions    No prescriptions requested or ordered in this encounter

## 2018-12-02 NOTE — Telephone Encounter (Signed)
Pt called asking if we would do xrays for her, pt tested positive for covid on Tuesday. Reviewed symptoms with patient and Dr. Lanny Cramp, at this time pt is low risk for complications, instructed pt what to look out for and follow up if getting worse, or starts having any respiratory symptoms. Pt agreeable to plan, all questions answered.

## 2019-09-13 ENCOUNTER — Ambulatory Visit (INDEPENDENT_AMBULATORY_CARE_PROVIDER_SITE_OTHER): Payer: BC Managed Care – PPO | Admitting: Internal Medicine

## 2019-09-13 ENCOUNTER — Encounter: Payer: Self-pay | Admitting: Internal Medicine

## 2019-09-13 ENCOUNTER — Ambulatory Visit (INDEPENDENT_AMBULATORY_CARE_PROVIDER_SITE_OTHER): Payer: BC Managed Care – PPO

## 2019-09-13 ENCOUNTER — Other Ambulatory Visit: Payer: Self-pay

## 2019-09-13 VITALS — BP 122/82 | HR 106 | Temp 99.1°F | Ht 63.0 in | Wt 255.0 lb

## 2019-09-13 DIAGNOSIS — K219 Gastro-esophageal reflux disease without esophagitis: Secondary | ICD-10-CM

## 2019-09-13 DIAGNOSIS — Z1159 Encounter for screening for other viral diseases: Secondary | ICD-10-CM

## 2019-09-13 DIAGNOSIS — E538 Deficiency of other specified B group vitamins: Secondary | ICD-10-CM | POA: Diagnosis not present

## 2019-09-13 DIAGNOSIS — M533 Sacrococcygeal disorders, not elsewhere classified: Secondary | ICD-10-CM

## 2019-09-13 DIAGNOSIS — R739 Hyperglycemia, unspecified: Secondary | ICD-10-CM

## 2019-09-13 DIAGNOSIS — R42 Dizziness and giddiness: Secondary | ICD-10-CM

## 2019-09-13 DIAGNOSIS — S3992XA Unspecified injury of lower back, initial encounter: Secondary | ICD-10-CM | POA: Diagnosis not present

## 2019-09-13 DIAGNOSIS — R1011 Right upper quadrant pain: Secondary | ICD-10-CM

## 2019-09-13 DIAGNOSIS — Z0001 Encounter for general adult medical examination with abnormal findings: Secondary | ICD-10-CM | POA: Diagnosis not present

## 2019-09-13 DIAGNOSIS — E559 Vitamin D deficiency, unspecified: Secondary | ICD-10-CM

## 2019-09-13 DIAGNOSIS — Z0000 Encounter for general adult medical examination without abnormal findings: Secondary | ICD-10-CM | POA: Diagnosis not present

## 2019-09-13 DIAGNOSIS — U071 COVID-19: Secondary | ICD-10-CM

## 2019-09-13 DIAGNOSIS — F418 Other specified anxiety disorders: Secondary | ICD-10-CM

## 2019-09-13 MED ORDER — MECLIZINE HCL 12.5 MG PO TABS: 12.5000 mg | ORAL_TABLET | Freq: Three times a day (TID) | ORAL | 1 refills | Status: AC | PRN

## 2019-09-13 NOTE — Patient Instructions (Signed)
Please take all new medication as prescribed - the meclizine  Please continue all other medications as before, and refills have been done if requested.  Please have the pharmacy call with any other refills you may need.  Please continue your efforts at being more active, low cholesterol diet, and weight control.  You are otherwise up to date with prevention measures today.  Please keep your appointments with your specialists as you may have planned  You will be contacted regarding the referral for: abdomen ultrasound  Please go to the XRAY Department in the first floor for the x-ray testing  Please go to the LAB at the blood drawing area for the tests to be done  You will be contacted by phone if any changes need to be made immediately.  Otherwise, you will receive a letter about your results with an explanation, but please check with MyChart first.  Please remember to sign up for MyChart if you have not done so, as this will be important to you in the future with finding out test results, communicating by private email, and scheduling acute appointments online when needed.  Please make an Appointment to return for your 1 year visit, or sooner if needed

## 2019-09-13 NOTE — Assessment & Plan Note (Signed)
stable overall by history and exam, recent data reviewed with pt, and pt to continue medical treatment as before,  to f/u any worsening symptoms or concerns  

## 2019-09-13 NOTE — Assessment & Plan Note (Signed)
Also for plain films r/o fx

## 2019-09-13 NOTE — Assessment & Plan Note (Signed)
fortunatley little to no recognized long haul symptoms,  to f/u any worsening symptoms or concerns

## 2019-09-13 NOTE — Progress Notes (Signed)
Subjective:    Patient ID: Darlene Lopez, female    DOB: December 26, 1970, 49 y.o.   MRN: [id number]  HPI  Here for wellness and f/u;  Overall doing ok;  Pt denies Chest pain, worsening SOB, DOE, wheezing, orthopnea, PND, worsening LE edema, palpitations, dizziness or syncope.  Pt denies neurological change such as new headache, facial or extremity weakness.  Pt denies polydipsia, polyuria, or low sugar symptoms. Pt states overall good compliance with treatment and medications, good tolerability, and has been trying to follow appropriate diet.  Pt denies worsening depressive symptoms, suicidal ideation or panic. No fever, night sweats, wt loss, loss of appetite, or other constitutional symptoms.  Pt states good ability with ADL's, has low fall risk, home safety reviewed and adequate, no other significant changes in hearing or vision, and only occasionally active with exercise. Also S/p covid infection oct 2020 with URI symptoms and fatigue and back pain.  Did also have a fall backwards with a misstep to the tailbone that still aches at times, worse to stand up or pivot in the chair.  Mother died in early Jun 14, 2018, grieving since then. Also c/o RUQ pain chronic intermittent soreness for about 1 yr. Denies worsening reflux, other abd pain, dysphagia, n/v, bowel change or blood.  ALso with recurring vertigo positional, 2020 MRI neg, and neg optho exam as well yesterday.   Past Medical History:  Diagnosis Date  . Chicken pox   . Kidney stones    Past Surgical History:  Procedure Laterality Date  . melanoma removal      reports that she has never smoked. She has never used smokeless tobacco. She reports that she does not drink alcohol and does not use drugs. family history includes Lung cancer in her paternal grandmother; Prostate cancer in her father. No Known Allergies Current Outpatient Medications on File Prior to Visit  Medication Sig Dispense Refill  . SUMAtriptan (IMITREX) 100 MG tablet Take 1  tablet (100 mg total) by mouth every 2 (two) hours as needed for migraine or headache. May repeat in 2 hours if headache persists or recurs. 10 tablet 5   No current facility-administered medications on file prior to visit.   Review of Systems All otherwise neg per pt     Objective:   Physical Exam BP 122/82 (BP Location: Left Arm, Patient Position: Sitting, Cuff Size: Large)   Pulse (!) 106   Temp 99.1 F (37.3 C) (Oral)   Ht 5\' 3"  (1.6 m)   Wt 255 lb (115.7 kg)   LMP 08/23/2019   SpO2 97%   BMI 45.17 kg/m  VS noted,  Constitutional: Pt appears in NAD HENT: Head: NCAT.  Right Ear: External ear normal.  Left Ear: External ear normal.  Eyes: . Pupils are equal, round, and reactive to light. Conjunctivae and EOM are normal Nose: without d/c or deformity Neck: Neck supple. Gross normal ROM Cardiovascular: Normal rate and regular rhythm.   Pulmonary/Chest: Effort normal and breath sounds without rales or wheezing.  Abd:  Soft, NT, ND, + BS, no organomegaly Neurological: Pt is alert. At baseline orientation, motor grossly intact Skin: Skin is warm. No rashes, other new lesions, no LE edema Psychiatric: Pt behavior is normal without agitation  All otherwise neg per pt Lab Results  Component Value Date   WBC 8.5 10/08/2016   HGB 15.2 (H) 10/08/2016   HCT 46.3 (H) 10/08/2016   PLT 254.0 10/08/2016   GLUCOSE 96 10/08/2016   CHOL 182 10/08/2016  TRIG 85.0 10/08/2016   HDL 45.30 10/08/2016   LDLDIRECT 150.0 08/01/2008   LDLCALC 120 (H) 10/08/2016   ALT 20 10/08/2016   AST 17 10/08/2016   NA 139 10/08/2016   K 4.3 10/08/2016   CL 102 10/08/2016   CREATININE 0.59 10/08/2016   BUN 13 10/08/2016   CO2 29 10/08/2016   TSH 3.24 10/08/2016   HGBA1C 5.6 10/08/2016       Assessment & Plan:

## 2019-09-13 NOTE — Assessment & Plan Note (Signed)
Mild o/w stable overall by history and exam, recent data reviewed with pt, and pt to continue medical treatment as before,  to f/u any worsening symptoms or concerns

## 2019-09-13 NOTE — Assessment & Plan Note (Signed)

## 2019-09-13 NOTE — Assessment & Plan Note (Signed)
Positional c/w peripheral middle ear - for meclizine prn

## 2019-09-13 NOTE — Assessment & Plan Note (Addendum)
Etiology unclear, cant r/o subacute GB issue - for abd u/s  I spent 41 minutes in addition to time for CPX wellness examination in preparing to see the patient by review of recent labs, imaging and procedures, obtaining and reviewing separately obtained history, communicating with the patient and family or caregiver, ordering medications, tests or procedures, and documenting clinical information in the EHR including the differential Dx, treatment, and any further evaluation and other management of ruq pain, taibone pain, covid 19 infection, vertigo, depression anxiety, gerd, hyperglycemia, hld

## 2019-09-14 ENCOUNTER — Encounter: Payer: Self-pay | Admitting: Internal Medicine

## 2019-09-14 LAB — LIPID PANEL
Cholesterol: 207 mg/dL — ABNORMAL HIGH (ref <200)
HDL: 54 mg/dL (ref >=50)
LDL Cholesterol (Calc): 131 mg/dL (calc) — ABNORMAL HIGH
Non-HDL Cholesterol (Calc): 153 mg/dL (calc) — ABNORMAL HIGH (ref <130)
Total CHOL/HDL Ratio: 3.8 (calc) (ref <5.0)
Triglycerides: 113 mg/dL (ref <150)

## 2019-09-14 LAB — CBC WITH DIFFERENTIAL/PLATELET
Absolute Monocytes: 600 cells/uL (ref 200–950)
Basophils Absolute: 44 cells/uL (ref 0–200)
Basophils Relative: 0.5 %
Eosinophils Absolute: 139 cells/uL (ref 15–500)
Eosinophils Relative: 1.6 %
HCT: 46.9 % — ABNORMAL HIGH (ref 35.0–45.0)
Hemoglobin: 15.5 g/dL (ref 11.7–15.5)
Lymphs Abs: 1844 cells/uL (ref 850–3900)
MCH: 29.4 pg (ref 27.0–33.0)
MCHC: 33 g/dL (ref 32.0–36.0)
MCV: 88.8 fL (ref 80.0–100.0)
MPV: 12.2 fL (ref 7.5–12.5)
Monocytes Relative: 6.9 %
Neutro Abs: 6073 cells/uL (ref 1500–7800)
Neutrophils Relative %: 69.8 %
Platelets: 238 10*3/uL (ref 140–400)
RBC: 5.28 10*6/uL — ABNORMAL HIGH (ref 3.80–5.10)
RDW: 12.9 % (ref 11.0–15.0)
Total Lymphocyte: 21.2 %
WBC: 8.7 10*3/uL (ref 3.8–10.8)

## 2019-09-14 LAB — HEPATITIS C ANTIBODY
Hepatitis C Ab: NONREACTIVE
SIGNAL TO CUT-OFF: 0.01 (ref <1.00)

## 2019-09-14 LAB — HEMOGLOBIN A1C
Hgb A1c MFr Bld: 5.4 % of total Hgb (ref <5.7)
Mean Plasma Glucose: 108 (calc)
eAG (mmol/L): 6 (calc)

## 2019-09-14 LAB — VITAMIN D 25 HYDROXY (VIT D DEFICIENCY, FRACTURES): Vit D, 25-Hydroxy: 53 ng/mL (ref 30–100)

## 2019-09-14 LAB — COMPLETE METABOLIC PANEL WITH GFR
AG Ratio: 1.7 (calc) (ref 1.0–2.5)
ALT: 19 U/L (ref 6–29)
AST: 16 U/L (ref 10–35)
Albumin: 4.3 g/dL (ref 3.6–5.1)
Alkaline phosphatase (APISO): 61 U/L (ref 31–125)
BUN: 15 mg/dL (ref 7–25)
CO2: 22 mmol/L (ref 20–32)
Calcium: 9.3 mg/dL (ref 8.6–10.2)
Chloride: 101 mmol/L (ref 98–110)
Creat: 0.63 mg/dL (ref 0.50–1.10)
GFR, Est African American: 123 mL/min/{1.73_m2} (ref >=60)
GFR, Est Non African American: 106 mL/min/{1.73_m2} (ref >=60)
Globulin: 2.6 g/dL (calc) (ref 1.9–3.7)
Glucose, Bld: 85 mg/dL (ref 65–99)
Potassium: 3.7 mmol/L (ref 3.5–5.3)
Sodium: 137 mmol/L (ref 135–146)
Total Bilirubin: 1.2 mg/dL (ref 0.2–1.2)
Total Protein: 6.9 g/dL (ref 6.1–8.1)

## 2019-09-14 LAB — URINALYSIS, ROUTINE W REFLEX MICROSCOPIC
Bilirubin Urine: NEGATIVE
Glucose, UA: NEGATIVE
Hgb urine dipstick: NEGATIVE
Leukocytes,Ua: NEGATIVE
Nitrite: NEGATIVE
Protein, ur: NEGATIVE
Specific Gravity, Urine: 1.013 (ref 1.001–1.03)
pH: 5.5 (ref 5.0–8.0)

## 2019-09-14 LAB — SARS-COV-2 IGG: SARS-COV-2 IgG: 2.18

## 2019-09-14 LAB — TSH: TSH: 3.67 mIU/L

## 2019-09-14 LAB — VITAMIN B12: Vitamin B-12: 415 pg/mL (ref 200–1100)

## 2019-09-15 ENCOUNTER — Encounter: Payer: Self-pay | Admitting: Internal Medicine

## 2019-09-22 DIAGNOSIS — Z20828 Contact with and (suspected) exposure to other viral communicable diseases: Secondary | ICD-10-CM | POA: Diagnosis not present

## 2019-09-22 DIAGNOSIS — J3489 Other specified disorders of nose and nasal sinuses: Secondary | ICD-10-CM | POA: Diagnosis not present

## 2019-10-04 DIAGNOSIS — Z20828 Contact with and (suspected) exposure to other viral communicable diseases: Secondary | ICD-10-CM | POA: Diagnosis not present

## 2019-10-04 DIAGNOSIS — J019 Acute sinusitis, unspecified: Secondary | ICD-10-CM | POA: Diagnosis not present

## 2019-10-04 DIAGNOSIS — J069 Acute upper respiratory infection, unspecified: Secondary | ICD-10-CM | POA: Diagnosis not present

## 2019-10-10 ENCOUNTER — Ambulatory Visit
Admission: RE | Admit: 2019-10-10 | Discharge: 2019-10-10 | Disposition: A | Discharge status: Home / Self Care | Payer: BC Managed Care – PPO | Source: Ambulatory Visit | Attending: Internal Medicine | Admitting: Internal Medicine

## 2019-10-10 DIAGNOSIS — K828 Other specified diseases of gallbladder: Secondary | ICD-10-CM | POA: Diagnosis not present

## 2019-10-10 DIAGNOSIS — N2889 Other specified disorders of kidney and ureter: Secondary | ICD-10-CM | POA: Diagnosis not present

## 2019-10-10 DIAGNOSIS — R1011 Right upper quadrant pain: Secondary | ICD-10-CM

## 2019-10-10 DIAGNOSIS — K7689 Other specified diseases of liver: Secondary | ICD-10-CM | POA: Diagnosis not present

## 2019-10-10 DIAGNOSIS — K760 Fatty (change of) liver, not elsewhere classified: Secondary | ICD-10-CM | POA: Diagnosis not present

## 2019-10-11 ENCOUNTER — Encounter: Payer: Self-pay | Admitting: Internal Medicine

## 2020-09-13 ENCOUNTER — Other Ambulatory Visit: Payer: Self-pay

## 2020-09-13 ENCOUNTER — Encounter: Payer: BC Managed Care – PPO | Admitting: Internal Medicine

## 2020-09-20 ENCOUNTER — Encounter: Payer: Self-pay | Admitting: Internal Medicine

## 2020-09-20 ENCOUNTER — Other Ambulatory Visit: Payer: Self-pay

## 2020-09-20 ENCOUNTER — Ambulatory Visit (INDEPENDENT_AMBULATORY_CARE_PROVIDER_SITE_OTHER): Payer: BC Managed Care – PPO | Admitting: Internal Medicine

## 2020-09-20 VITALS — BP 128/80 | HR 91 | Temp 98.2°F | Ht 63.0 in | Wt 260.0 lb

## 2020-09-20 DIAGNOSIS — E538 Deficiency of other specified B group vitamins: Secondary | ICD-10-CM | POA: Diagnosis not present

## 2020-09-20 DIAGNOSIS — R739 Hyperglycemia, unspecified: Secondary | ICD-10-CM

## 2020-09-20 DIAGNOSIS — F418 Other specified anxiety disorders: Secondary | ICD-10-CM | POA: Diagnosis not present

## 2020-09-20 DIAGNOSIS — E559 Vitamin D deficiency, unspecified: Secondary | ICD-10-CM | POA: Diagnosis not present

## 2020-09-20 DIAGNOSIS — E7800 Pure hypercholesterolemia, unspecified: Secondary | ICD-10-CM | POA: Diagnosis not present

## 2020-09-20 DIAGNOSIS — Z0001 Encounter for general adult medical examination with abnormal findings: Secondary | ICD-10-CM | POA: Diagnosis not present

## 2020-09-20 LAB — URINALYSIS, ROUTINE W REFLEX MICROSCOPIC
Bilirubin Urine: NEGATIVE
Hgb urine dipstick: NEGATIVE
Leukocytes,Ua: NEGATIVE
Nitrite: NEGATIVE
RBC / HPF: NONE SEEN (ref 0–?)
Specific Gravity, Urine: 1.015 (ref 1.000–1.030)
Total Protein, Urine: NEGATIVE
Urine Glucose: NEGATIVE
Urobilinogen, UA: 0.2 (ref 0.0–1.0)
WBC, UA: NONE SEEN (ref 0–?)
pH: 6 (ref 5.0–8.0)

## 2020-09-20 LAB — BASIC METABOLIC PANEL
BUN: 14 mg/dL (ref 6–23)
CO2: 30 mEq/L (ref 19–32)
Calcium: 9.2 mg/dL (ref 8.4–10.5)
Chloride: 101 mEq/L (ref 96–112)
Creatinine, Ser: 0.58 mg/dL (ref 0.40–1.20)
GFR: 106.04 mL/min (ref >60.00)
Glucose, Bld: 89 mg/dL (ref 70–99)
Potassium: 4.3 mEq/L (ref 3.5–5.1)
Sodium: 139 mEq/L (ref 135–145)

## 2020-09-20 LAB — CBC WITH DIFFERENTIAL/PLATELET
Basophils Absolute: 0 10*3/uL (ref 0.0–0.1)
Basophils Relative: 0.6 % (ref 0.0–3.0)
Eosinophils Absolute: 0.2 10*3/uL (ref 0.0–0.7)
Eosinophils Relative: 2 % (ref 0.0–5.0)
HCT: 46.3 % — ABNORMAL HIGH (ref 36.0–46.0)
Hemoglobin: 14.9 g/dL (ref 12.0–15.0)
Lymphocytes Relative: 20.2 % (ref 12.0–46.0)
Lymphs Abs: 1.5 10*3/uL (ref 0.7–4.0)
MCHC: 32.2 g/dL (ref 30.0–36.0)
MCV: 89.6 fl (ref 78.0–100.0)
Monocytes Absolute: 0.5 10*3/uL (ref 0.1–1.0)
Monocytes Relative: 6.9 % (ref 3.0–12.0)
Neutro Abs: 5.3 10*3/uL (ref 1.4–7.7)
Neutrophils Relative %: 70.3 % (ref 43.0–77.0)
Platelets: 255 10*3/uL (ref 150.0–400.0)
RBC: 5.17 Mil/uL — ABNORMAL HIGH (ref 3.87–5.11)
RDW: 14.4 % (ref 11.5–15.5)
WBC: 7.5 10*3/uL (ref 4.0–10.5)

## 2020-09-20 LAB — HEPATIC FUNCTION PANEL
ALT: 25 U/L (ref 0–35)
AST: 19 U/L (ref 0–37)
Albumin: 4.4 g/dL (ref 3.5–5.2)
Alkaline Phosphatase: 60 U/L (ref 39–117)
Bilirubin, Direct: 0.1 mg/dL (ref 0.0–0.3)
Total Bilirubin: 1.1 mg/dL (ref 0.2–1.2)
Total Protein: 7.4 g/dL (ref 6.0–8.3)

## 2020-09-20 LAB — LIPID PANEL
Cholesterol: 213 mg/dL — ABNORMAL HIGH (ref 0–200)
HDL: 48 mg/dL (ref >39.00)
LDL Cholesterol: 140 mg/dL — ABNORMAL HIGH (ref 0–99)
NonHDL: 165.23
Total CHOL/HDL Ratio: 4
Triglycerides: 125 mg/dL (ref 0.0–149.0)
VLDL: 25 mg/dL (ref 0.0–40.0)

## 2020-09-20 LAB — VITAMIN D 25 HYDROXY (VIT D DEFICIENCY, FRACTURES): VITD: 67.85 ng/mL (ref 30.00–100.00)

## 2020-09-20 LAB — HEMOGLOBIN A1C: Hgb A1c MFr Bld: 5.8 % (ref 4.6–6.5)

## 2020-09-20 LAB — TSH: TSH: 5.21 u[IU]/mL (ref 0.35–5.50)

## 2020-09-20 LAB — VITAMIN B12: Vitamin B-12: 318 pg/mL (ref 211–911)

## 2020-09-20 NOTE — Assessment & Plan Note (Signed)
Lab Results  Component Value Date   HGBA1C 5.8 09/20/2020   Stable, pt to continue current medical treatment  - diet

## 2020-09-20 NOTE — Assessment & Plan Note (Signed)
Age and sex appropriate education and counseling updated with regular exercise and diet Referrals for preventative services - declines colonoscopy, plans to call for GYN appt soon herself Immunizations addressed - declines covld vzax, pneumovax Smoking counseling  - none needed Evidence for depression or other mood disorder - chronic anxiety/depression stable - declines change in tx Most recent labs reviewed. I have personally reviewed and have noted: 1) the patient's medical and social history 2) The patient's current medications and supplements 3) The patient's height, weight, and BMI have been recorded in the chart

## 2020-09-20 NOTE — Progress Notes (Signed)
Chief Complaint:: wellness exam and hyeprglycemia, hld, depression       HPI:  Darlene Lopez is a 50 y.o. female here for wellness exam; pt plans to call for GYN appt herself for pap, declines covid vax, pneumovax, colonoscopy for now; o/w up to date with preventive referrals and immunizations                        Also Pt denies chest pain, increased sob or doe, wheezing, orthopnea, PND, increased LE swelling, palpitations, dizziness or syncope.   Pt denies polydipsia, polyuria, or new focal neuro s/s.   Pt denies fever, wt loss, night sweats, loss of appetite, or other constitutional symptoms   Denies worsening depressive symptoms, suicidal ideation, or panic; has ongoing anxiety, not increased recently. Unable to lose significant wt.  But plans to change life style to include more exercise and wt loss now that some stressors are less.     Wt Readings from Last 3 Encounters:  09/20/20 260 lb (117.9 kg)  09/13/19 255 lb (115.7 kg)  03/29/17 241 lb (109.3 kg)   BP Readings from Last 3 Encounters:  09/20/20 128/80  09/13/19 122/82  03/29/17 132/84   Immunization History  Administered Date(s) Administered   Influenza Split 03/02/2012   Td 02/23/2006   Tdap 10/08/2016   There are no preventive care reminders to display for this patient.     Past Medical History:  Diagnosis Date   Chicken pox    Kidney stones    Past Surgical History:  Procedure Laterality Date   melanoma removal      reports that she has never smoked. She has never used smokeless tobacco. She reports that she does not drink alcohol and does not use drugs. family history includes Lung cancer in her paternal grandmother; Prostate cancer in her father. No Known Allergies Current Outpatient Medications on File Prior to Visit  Medication Sig Dispense Refill   SUMAtriptan (IMITREX) 100 MG tablet Take 1 tablet (100 mg total) by mouth every 2 (two) hours as needed for migraine or headache. May repeat in 2  hours if headache persists or recurs. 10 tablet 5   No current facility-administered medications on file prior to visit.        ROS:  All others reviewed and negative.  Objective        PE:  BP 128/80   Pulse 91   Temp 98.2 F (36.8 C) (Oral)   Ht '5\' 3"'$  (1.6 m)   Wt 260 lb (117.9 kg)   SpO2 97%   BMI 46.06 kg/m                 Constitutional: Pt appears in NAD               HENT: Head: NCAT.                Right Ear: External ear normal.                 Left Ear: External ear normal.                Eyes: . Pupils are equal, round, and reactive to light. Conjunctivae and EOM are normal               Nose: without d/c or deformity               Neck: Neck supple. Darlene Lopez  normal ROM               Cardiovascular: Normal rate and regular rhythm.                 Pulmonary/Chest: Effort normal and breath sounds without rales or wheezing.                Abd:  Soft, NT, ND, + BS, no organomegaly               Neurological: Pt is alert. At baseline orientation, motor grossly intact               Skin: Skin is warm. No rashes, no other new lesions, LE edema - none               Psychiatric: Pt behavior is normal without agitation   Micro: none  Cardiac tracings I have personally interpreted today:  none  Pertinent Radiological findings (summarize): none   Lab Results  Component Value Date   WBC 7.5 09/20/2020   HGB 14.9 09/20/2020   HCT 46.3 (H) 09/20/2020   PLT 255.0 09/20/2020   GLUCOSE 89 09/20/2020   CHOL 213 (H) 09/20/2020   TRIG 125.0 09/20/2020   HDL 48.00 09/20/2020   LDLDIRECT 150.0 08/01/2008   LDLCALC 140 (H) 09/20/2020   ALT 25 09/20/2020   AST 19 09/20/2020   NA 139 09/20/2020   K 4.3 09/20/2020   CL 101 09/20/2020   CREATININE 0.58 09/20/2020   BUN 14 09/20/2020   CO2 30 09/20/2020   TSH 5.21 09/20/2020   HGBA1C 5.8 09/20/2020   Assessment/Plan:  Darlene Lopez is a 50 y.o. White or Caucasian [1] female with  has a past medical history of Chicken pox  and Kidney stones.  HLD (hyperlipidemia) Lab Results  Component Value Date   LDLCALC 131 (H) 09/13/2019   Uncontrolled, pt to continue low chol diet, declines statin for now   Encounter for well adult exam with abnormal findings Age and sex appropriate education and counseling updated with regular exercise and diet Referrals for preventative services - declines colonoscopy, plans to call for GYN appt soon herself Immunizations addressed - declines covld vzax, pneumovax Smoking counseling  - none needed Evidence for depression or other mood disorder - chronic anxiety/depression stable - declines change in tx Most recent labs reviewed. I have personally reviewed and have noted: 1) the patient's medical and social history 2) The patient's current medications and supplements 3) The patient's height, weight, and BMI have been recorded in the chart   Hyperglycemia Lab Results  Component Value Date   HGBA1C 5.8 09/20/2020   Stable, pt to continue current medical treatment  - diet   Depression with anxiety Stable, reassured, declines need for change in tx or counseling referral Followup: Return in about 1 year (around 09/20/2021).  Cathlean Cower, MD 09/20/2020 10:58 PM [facility]

## 2020-09-20 NOTE — Assessment & Plan Note (Signed)
Lab Results  Component Value Date   LDLCALC 131 (H) 09/13/2019   Uncontrolled, pt to continue low chol diet, declines statin for now

## 2020-09-20 NOTE — Assessment & Plan Note (Signed)
Stable, reassured, declines need for change in tx or counseling referral

## 2020-09-20 NOTE — Patient Instructions (Signed)
Please check about the cologuard with your insurance and let us know if you want this, or even the colonoscopy  Please continue all other medications as before, and refills have been done if requested.  Please have the pharmacy call with any other refills you may need.  Please continue your efforts at being more active, low cholesterol diet, and weight control.  You are otherwise up to date with prevention measures today.  Please keep your appointments with your specialists as you may have planned  Please go to the LAB at the blood drawing area for the tests to be done  You will be contacted by phone if any changes need to be made immediately.  Otherwise, you will receive a letter about your results with an explanation, but please check with MyChart first.  Please remember to sign up for MyChart if you have not done so, as this will be important to you in the future with finding out test results, communicating by private email, and scheduling acute appointments online when needed.  Please make an Appointment to return for your 1 year visit, or sooner if needed

## 2021-03-05 DIAGNOSIS — L680 Hirsutism: Secondary | ICD-10-CM | POA: Diagnosis not present

## 2021-03-05 DIAGNOSIS — Z1151 Encounter for screening for human papillomavirus (HPV): Secondary | ICD-10-CM | POA: Diagnosis not present

## 2021-03-05 DIAGNOSIS — Z01419 Encounter for gynecological examination (general) (routine) without abnormal findings: Secondary | ICD-10-CM | POA: Diagnosis not present

## 2021-09-22 ENCOUNTER — Encounter: Payer: BC Managed Care – PPO | Admitting: Internal Medicine

## 2021-10-20 ENCOUNTER — Encounter: Payer: BC Managed Care – PPO | Admitting: Internal Medicine

## 2021-10-21 ENCOUNTER — Ambulatory Visit (INDEPENDENT_AMBULATORY_CARE_PROVIDER_SITE_OTHER): Payer: BC Managed Care – PPO | Admitting: Internal Medicine

## 2021-10-21 ENCOUNTER — Encounter: Payer: Self-pay | Admitting: Internal Medicine

## 2021-10-21 VITALS — BP 126/76 | HR 85 | Temp 98.2°F | Ht 63.0 in | Wt 260.0 lb

## 2021-10-21 DIAGNOSIS — Z1211 Encounter for screening for malignant neoplasm of colon: Secondary | ICD-10-CM | POA: Diagnosis not present

## 2021-10-21 DIAGNOSIS — E7800 Pure hypercholesterolemia, unspecified: Secondary | ICD-10-CM | POA: Diagnosis not present

## 2021-10-21 DIAGNOSIS — E559 Vitamin D deficiency, unspecified: Secondary | ICD-10-CM

## 2021-10-21 DIAGNOSIS — R739 Hyperglycemia, unspecified: Secondary | ICD-10-CM | POA: Diagnosis not present

## 2021-10-21 DIAGNOSIS — Z0001 Encounter for general adult medical examination with abnormal findings: Secondary | ICD-10-CM | POA: Diagnosis not present

## 2021-10-21 DIAGNOSIS — R9431 Abnormal electrocardiogram [ECG] [EKG]: Secondary | ICD-10-CM

## 2021-10-21 DIAGNOSIS — E538 Deficiency of other specified B group vitamins: Secondary | ICD-10-CM | POA: Diagnosis not present

## 2021-10-21 DIAGNOSIS — Z6841 Body Mass Index (BMI) 40.0 and over, adult: Secondary | ICD-10-CM

## 2021-10-21 LAB — URINALYSIS, ROUTINE W REFLEX MICROSCOPIC
Bilirubin Urine: NEGATIVE
Hgb urine dipstick: NEGATIVE
Ketones, ur: NEGATIVE
Leukocytes,Ua: NEGATIVE
Nitrite: NEGATIVE
RBC / HPF: NONE SEEN (ref 0–?)
Specific Gravity, Urine: 1.015 (ref 1.000–1.030)
Total Protein, Urine: NEGATIVE
Urine Glucose: NEGATIVE
Urobilinogen, UA: 0.2 (ref 0.0–1.0)
pH: 6.5 (ref 5.0–8.0)

## 2021-10-21 LAB — BASIC METABOLIC PANEL
BUN: 14 mg/dL (ref 6–23)
CO2: 27 mEq/L (ref 19–32)
Calcium: 9.2 mg/dL (ref 8.4–10.5)
Chloride: 102 mEq/L (ref 96–112)
Creatinine, Ser: 0.56 mg/dL (ref 0.40–1.20)
GFR: 106.13 mL/min (ref >60.00)
Glucose, Bld: 93 mg/dL (ref 70–99)
Potassium: 4 mEq/L (ref 3.5–5.1)
Sodium: 139 mEq/L (ref 135–145)

## 2021-10-21 LAB — HEPATIC FUNCTION PANEL
ALT: 23 U/L (ref 0–35)
AST: 19 U/L (ref 0–37)
Albumin: 4.1 g/dL (ref 3.5–5.2)
Alkaline Phosphatase: 62 U/L (ref 39–117)
Bilirubin, Direct: 0.1 mg/dL (ref 0.0–0.3)
Total Bilirubin: 1.1 mg/dL (ref 0.2–1.2)
Total Protein: 7.2 g/dL (ref 6.0–8.3)

## 2021-10-21 LAB — VITAMIN B12: Vitamin B-12: 230 pg/mL (ref 211–911)

## 2021-10-21 LAB — CBC WITH DIFFERENTIAL/PLATELET
Basophils Absolute: 0 10*3/uL (ref 0.0–0.1)
Basophils Relative: 0.6 % (ref 0.0–3.0)
Eosinophils Absolute: 0.1 10*3/uL (ref 0.0–0.7)
Eosinophils Relative: 2.2 % (ref 0.0–5.0)
HCT: 44.8 % (ref 36.0–46.0)
Hemoglobin: 14.8 g/dL (ref 12.0–15.0)
Lymphocytes Relative: 19.6 % (ref 12.0–46.0)
Lymphs Abs: 1.2 10*3/uL (ref 0.7–4.0)
MCHC: 33.1 g/dL (ref 30.0–36.0)
MCV: 88.7 fl (ref 78.0–100.0)
Monocytes Absolute: 0.5 10*3/uL (ref 0.1–1.0)
Monocytes Relative: 7.8 % (ref 3.0–12.0)
Neutro Abs: 4.4 10*3/uL (ref 1.4–7.7)
Neutrophils Relative %: 69.8 % (ref 43.0–77.0)
Platelets: 248 10*3/uL (ref 150.0–400.0)
RBC: 5.05 Mil/uL (ref 3.87–5.11)
RDW: 13.9 % (ref 11.5–15.5)
WBC: 6.3 10*3/uL (ref 4.0–10.5)

## 2021-10-21 LAB — LIPID PANEL
Cholesterol: 214 mg/dL — ABNORMAL HIGH (ref 0–200)
HDL: 46.5 mg/dL (ref >39.00)
LDL Cholesterol: 146 mg/dL — ABNORMAL HIGH (ref 0–99)
NonHDL: 167.46
Total CHOL/HDL Ratio: 5
Triglycerides: 108 mg/dL (ref 0.0–149.0)
VLDL: 21.6 mg/dL (ref 0.0–40.0)

## 2021-10-21 LAB — MICROALBUMIN / CREATININE URINE RATIO
Creatinine,U: 62.9 mg/dL
Microalb Creat Ratio: 1.1 mg/g (ref 0.0–30.0)
Microalb, Ur: <0.7 mg/dL (ref 0.0–1.9)

## 2021-10-21 LAB — VITAMIN D 25 HYDROXY (VIT D DEFICIENCY, FRACTURES): VITD: 59.62 ng/mL (ref 30.00–100.00)

## 2021-10-21 LAB — HEMOGLOBIN A1C: Hgb A1c MFr Bld: 6 % (ref 4.6–6.5)

## 2021-10-21 LAB — TSH: TSH: 2.43 u[IU]/mL (ref 0.35–5.50)

## 2021-10-21 MED ORDER — SUMATRIPTAN SUCCINATE 100 MG PO TABS: 100.0000 mg | ORAL_TABLET | ORAL | 5 refills | Status: AC | PRN

## 2021-10-21 NOTE — Progress Notes (Unsigned)
Patient ID: Darlene Lopez, female   DOB: [birth year], 51 y.o.   MRN: [id number]         Chief Complaint:: wellness exam and obesity, hld       HPI:  Darlene Lopez is a 51 y.o. female here for wellness exam; plans to see GYN soon for pap, mamogram, also declines colonoscopy but ok for cologuard, o/w up to date                        Also s/p covid infection oct 2020 and oct 2022.  Hard to lose wt with diet, exercise.  Pt denies chest pain, increased sob or doe, wheezing, orthopnea, PND, increased LE swelling, palpitations, dizziness or syncope.   Pt denies polydipsia, polyuria, or new focal neuro s/s.    Pt denies fever, wt loss, night sweats, loss of appetite, or other constitutional symptoms     Wt Readings from Last 3 Encounters:  10/21/21 260 lb (117.9 kg)  09/20/20 260 lb (117.9 kg)  09/13/19 255 lb (115.7 kg)   BP Readings from Last 3 Encounters:  10/21/21 126/76  09/20/20 128/80  09/13/19 122/82   Immunization History  Administered Date(s) Administered   Influenza Split 03/02/2012   Td 02/23/2006   Tdap 10/08/2016   Health Maintenance Due  Topic Date Due   PAP SMEAR-Modifier  08/23/2010   COLONOSCOPY (Pts 45-49yr Insurance coverage will need to be confirmed)  Never done      Past Medical History:  Diagnosis Date   Chicken pox    Kidney stones    Past Surgical History:  Procedure Laterality Date   melanoma removal      reports that she has never smoked. She has never used smokeless tobacco. She reports that she does not drink alcohol and does not use drugs. family history includes Lung cancer in her paternal grandmother; Prostate cancer in her father. No Known Allergies No current outpatient medications on file prior to visit.   No current facility-administered medications on file prior to visit.        ROS:  All others reviewed and negative.  Objective        PE:  BP 126/76 (BP Location: Right Arm, Patient Position: Sitting, Cuff Size: Large)   Pulse  85   Temp 98.2 F (36.8 C) (Oral)   Ht '5\' 3"'$  (1.6 m)   Wt 260 lb (117.9 kg)   SpO2 98%   BMI 46.06 kg/m                 Constitutional: Pt appears in NAD               HENT: Head: NCAT.                Right Ear: External ear normal.                 Left Ear: External ear normal.                Eyes: . Pupils are equal, round, and reactive to light. Conjunctivae and EOM are normal               Nose: without d/c or deformity               Neck: Neck supple. Gross normal ROM               Cardiovascular: Normal rate and regular rhythm.  Pulmonary/Chest: Effort normal and breath sounds without rales or wheezing.                Abd:  Soft, NT, ND, + BS, no organomegaly               Neurological: Pt is alert. At baseline orientation, motor grossly intact               Skin: Skin is warm. No rashes, no other new lesions, LE edema - none               Psychiatric: Pt behavior is normal without agitation   Micro: none  Cardiac tracings I have personally interpreted today:  none  Pertinent Radiological findings (summarize): none   Lab Results  Component Value Date   WBC 6.3 10/21/2021   HGB 14.8 10/21/2021   HCT 44.8 10/21/2021   PLT 248.0 10/21/2021   GLUCOSE 93 10/21/2021   CHOL 214 (H) 10/21/2021   TRIG 108.0 10/21/2021   HDL 46.50 10/21/2021   LDLDIRECT 150.0 08/01/2008   LDLCALC 146 (H) 10/21/2021   ALT 23 10/21/2021   AST 19 10/21/2021   NA 139 10/21/2021   K 4.0 10/21/2021   CL 102 10/21/2021   CREATININE 0.56 10/21/2021   BUN 14 10/21/2021   CO2 27 10/21/2021   TSH 2.43 10/21/2021   HGBA1C 6.0 10/21/2021   MICROALBUR <0.7 10/21/2021   Assessment/Plan:  Darlene Lopez is a 51 y.o. White or Caucasian [1] female with  has a past medical history of Chicken pox and Kidney stones.  Encounter for well adult exam with abnormal findings Age and sex appropriate education and counseling updated with regular exercise and diet Referrals for preventative  services - for cologuard, declines colonoscopy, plans to call soon for GYN and pap Immunizations addressed - none needed Smoking counseling  - none needed Evidence for depression or other mood disorder - none significant Most recent labs reviewed. I have personally reviewed and have noted: 1) the patient's medical and social history 2) The patient's current medications and supplements 3) The patient's height, weight, and BMI have been recorded in the chart   HLD (hyperlipidemia) Lab Results  Component Value Date   LDLCALC 146 (H) 10/21/2021   uncontrolled, pt declines statin, but will have cardiac CT score  Hyperglycemia Lab Results  Component Value Date   HGBA1C 6.0 10/21/2021   Stable, pt to continue current medical treatment  - diet, wt control, excercise   OBESITY D/w pt  - delcines wegovy for now  Followup: Return in about 1 year (around 10/22/2022).  Cathlean Cower, MD 10/22/2021 4:05 PM [facility]

## 2021-10-21 NOTE — Patient Instructions (Addendum)
Please have your Shingrix (shingles) shots done at your local pharmacy.  You will be contacted regarding the referral for: cologuard (they send your kit to your home)  You will be contacted regarding the referral for cardiac CT score  Please continue all other medications as before, and refills have been done if requested.  Please have the pharmacy call with any other refills you may need.  Please continue your efforts at being more active, low cholesterol diet, and weight control.  You are otherwise up to date with prevention measures today.  Please keep your appointments with your specialists as you may have planned  Please go to the LAB at the blood drawing area for the tests to be done  You will be contacted by phone if any changes need to be made immediately.  Otherwise, you will receive a letter about your results with an explanation, but please check with MyChart first.  Please remember to sign up for MyChart if you have not done so, as this will be important to you in the future with finding out test results, communicating by private email, and scheduling acute appointments online when needed.  Please make an Appointment to return for your 1 year visit, or sooner if needed

## 2021-10-22 ENCOUNTER — Encounter: Payer: Self-pay | Admitting: Internal Medicine

## 2021-10-22 NOTE — Assessment & Plan Note (Signed)
Lab Results  Component Value Date   LDLCALC 146 (H) 10/21/2021   uncontrolled, pt declines statin, but will have cardiac CT score

## 2021-10-22 NOTE — Assessment & Plan Note (Signed)
Age and sex appropriate education and counseling updated with regular exercise and diet Referrals for preventative services - for cologuard, declines colonoscopy, plans to call soon for GYN and pap Immunizations addressed - none needed Smoking counseling  - none needed Evidence for depression or other mood disorder - none significant Most recent labs reviewed. I have personally reviewed and have noted: 1) the patient's medical and social history 2) The patient's current medications and supplements 3) The patient's height, weight, and BMI have been recorded in the chart

## 2021-10-22 NOTE — Assessment & Plan Note (Signed)
D/w pt  - delcines wegovy for now

## 2021-10-22 NOTE — Assessment & Plan Note (Signed)
Lab Results  Component Value Date   HGBA1C 6.0 10/21/2021   Stable, pt to continue current medical treatment  - diet, wt control, excercise

## 2021-11-06 ENCOUNTER — Encounter: Payer: BC Managed Care – PPO | Admitting: Internal Medicine

## 2021-11-25 DIAGNOSIS — Z1211 Encounter for screening for malignant neoplasm of colon: Secondary | ICD-10-CM | POA: Diagnosis not present

## 2021-12-01 LAB — COLOGUARD: COLOGUARD: NEGATIVE

## 2021-12-03 ENCOUNTER — Ambulatory Visit (HOSPITAL_BASED_OUTPATIENT_CLINIC_OR_DEPARTMENT_OTHER)
Admission: RE | Admit: 2021-12-03 | Discharge: 2021-12-03 | Disposition: A | Discharge status: Home / Self Care | Payer: BC Managed Care – PPO | Source: Ambulatory Visit | Attending: Internal Medicine | Admitting: Internal Medicine

## 2021-12-03 DIAGNOSIS — E7800 Pure hypercholesterolemia, unspecified: Secondary | ICD-10-CM | POA: Insufficient documentation

## 2021-12-03 DIAGNOSIS — R739 Hyperglycemia, unspecified: Secondary | ICD-10-CM | POA: Insufficient documentation

## 2021-12-03 DIAGNOSIS — R9431 Abnormal electrocardiogram [ECG] [EKG]: Secondary | ICD-10-CM | POA: Insufficient documentation

## 2021-12-16 IMAGING — US US ABDOMEN COMPLETE
1 series · 13 of 25 positions shown · non-contrast
Comparison: Remote abdominal ultrasound 08/09/2008 noncontrast CT
06/04/2012

CLINICAL DATA: Intermittent right upper quadrant pain.

EXAM:
ABDOMEN ULTRASOUND COMPLETE

[Series 1: us abdomen complete · 0.23mm/px · 13 of 110 slices shown]
[im 1/110]
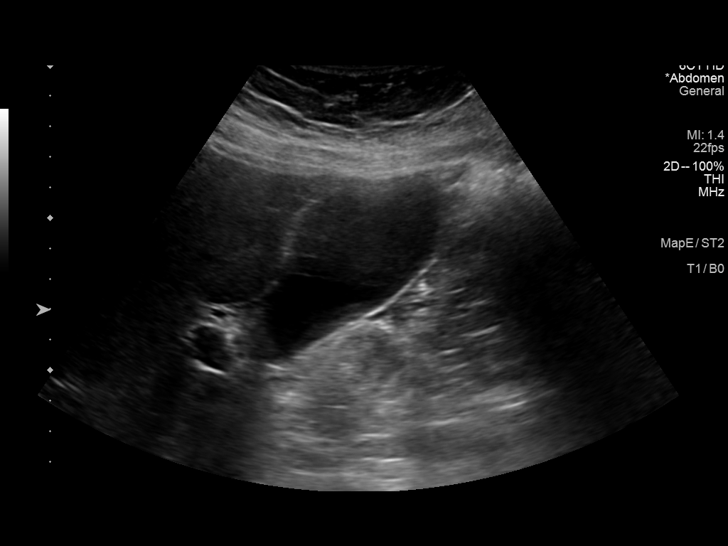
[im 10/110]
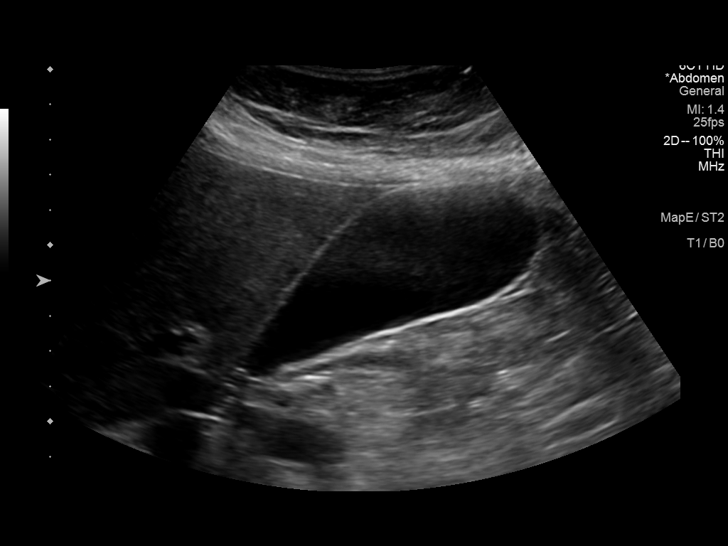
[im 19/110]
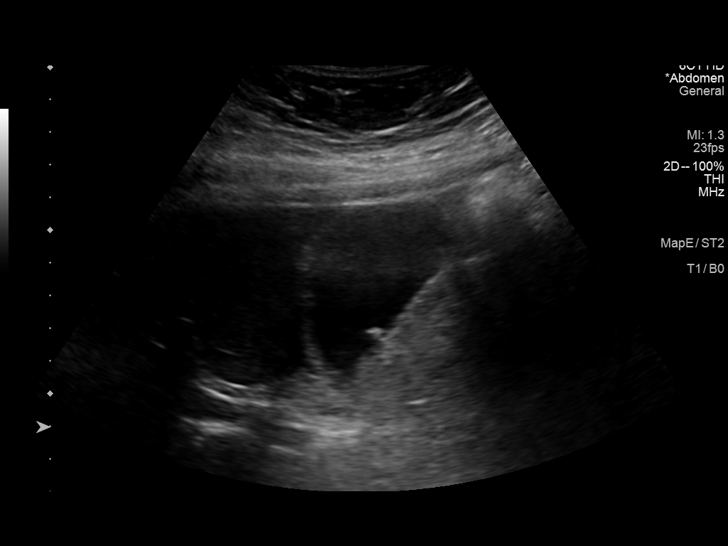
[im 28/110]
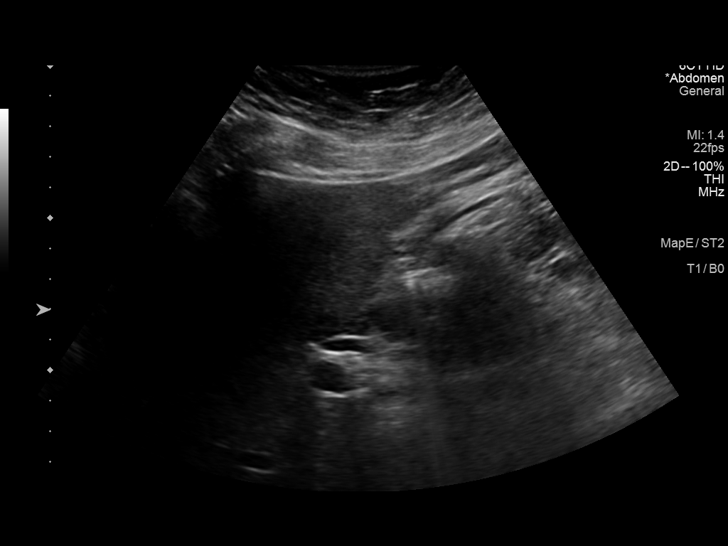
[im 37/110]
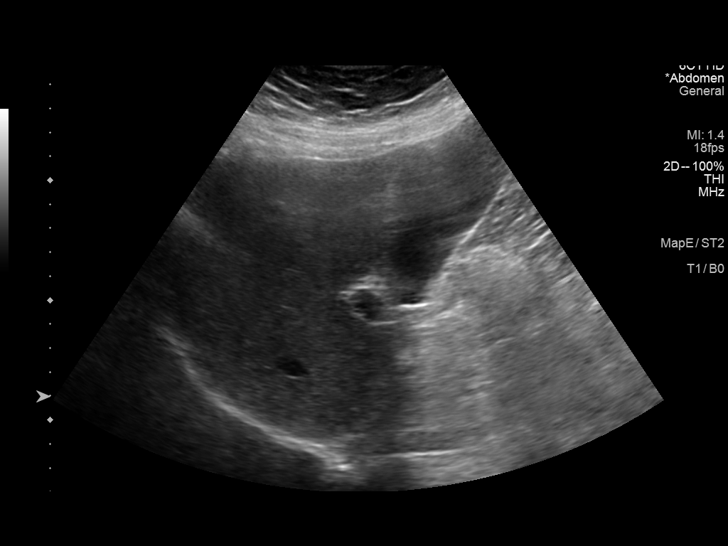
[im 46/110]
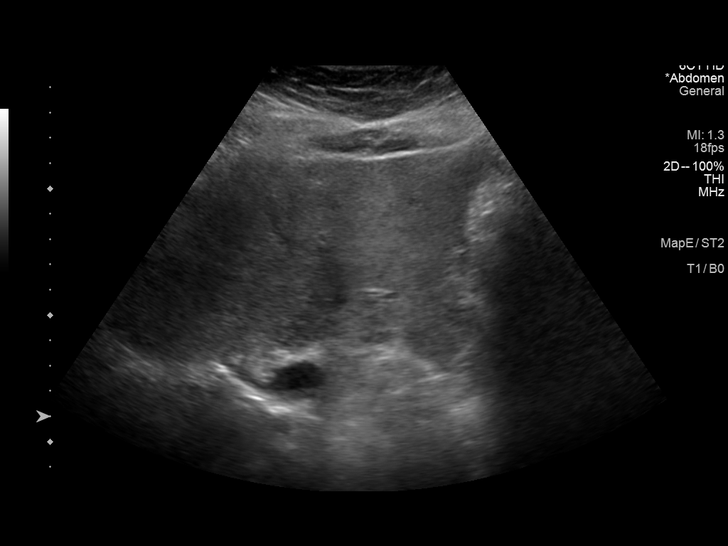
[im 55/110]
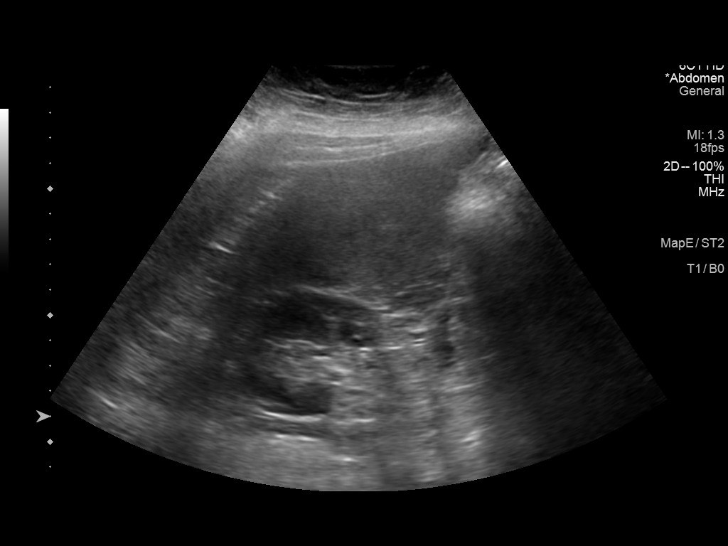
[im 64/110]
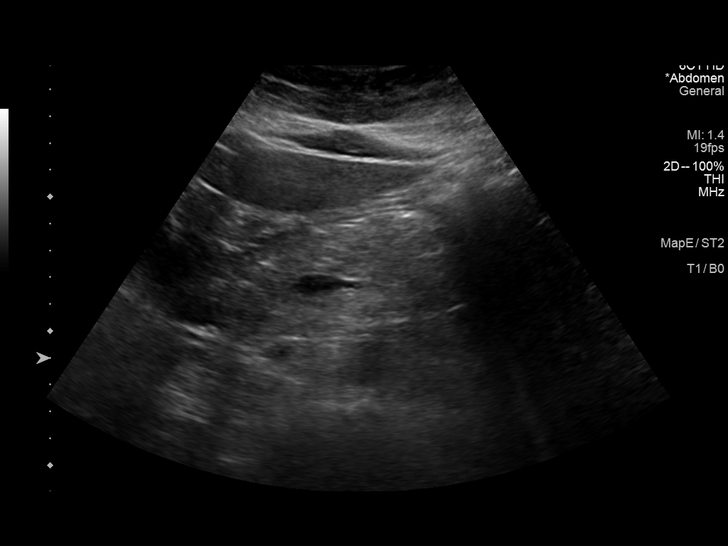
[im 73/110]
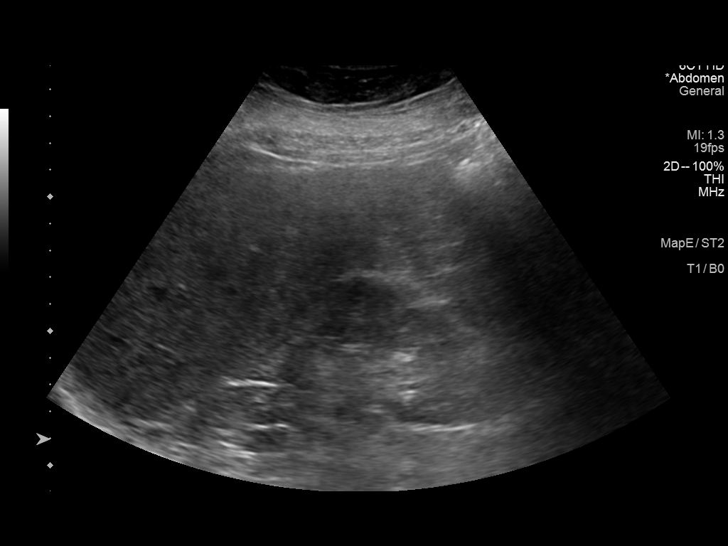
[im 82/110]
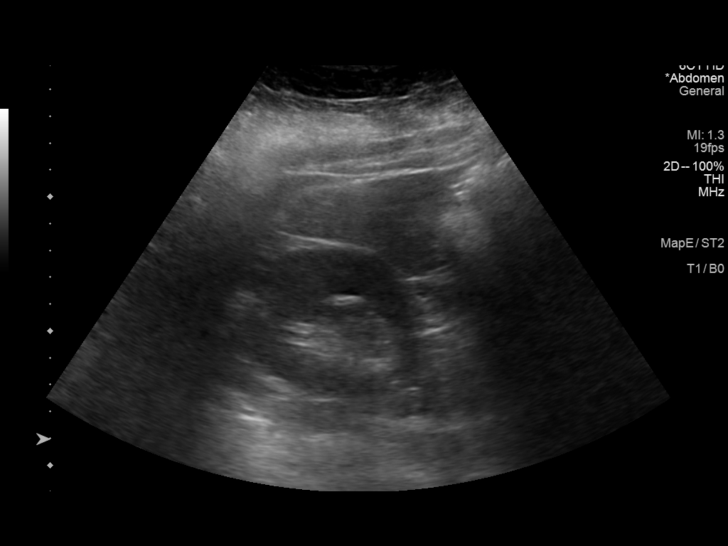
[im 91/110]
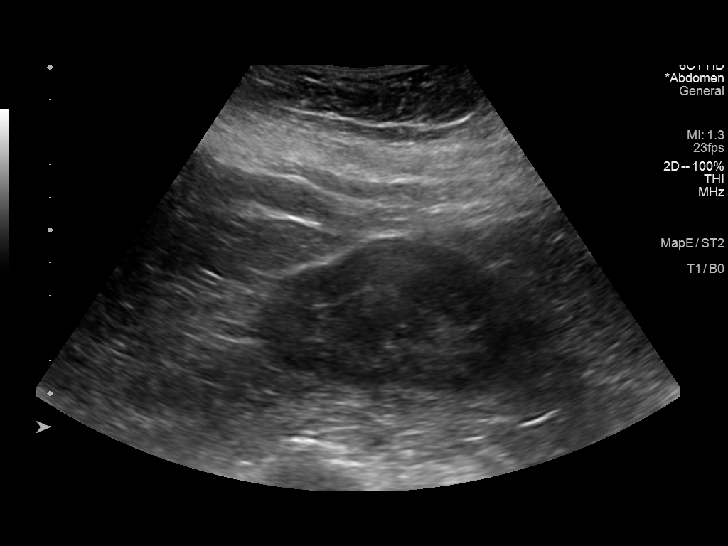
[im 100/110]
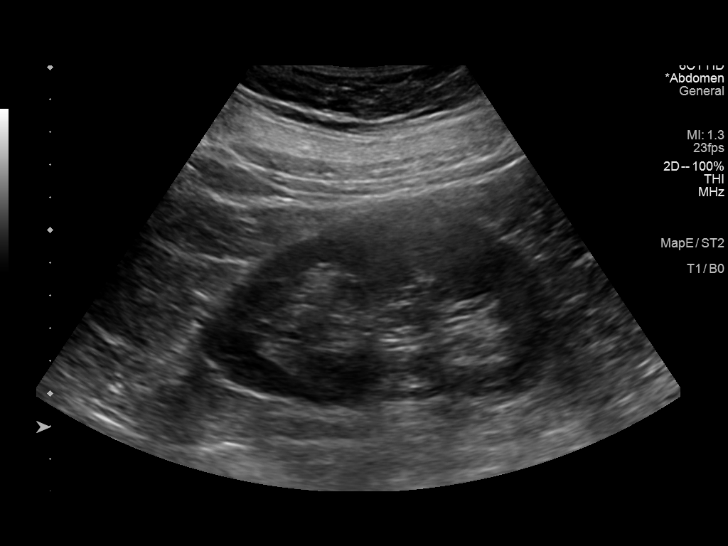
[im 110/110]
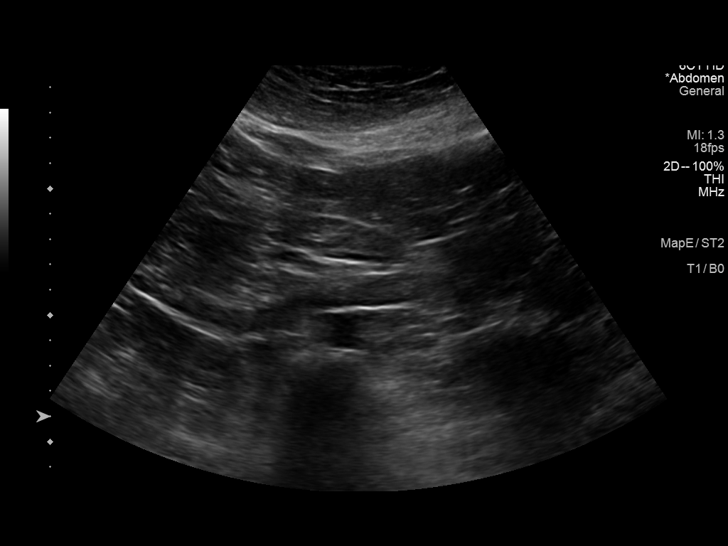

[13 of 25 positions shown; findings below may reference images not displayed]

FINDINGS: Gallbladder: Mildly distended. Borderline wall thickness of 3-4 mm.
No gallstones, however there is an echogenic focus adherent to the
gallbladder wall that is likely a polyp, 5 mm. No pericholecystic
fluid. No sonographic Murphy sign noted by sonographer.

Common bile duct: Diameter: 4 mm, normal.

Liver: No focal lesion identified. Diffusely increased in
parenchymal echogenicity. Portal vein is patent on color Doppler
imaging with normal direction of blood flow towards the liver.

IVC: No abnormality visualized.

Pancreas: Visualized portion unremarkable.  Distal tail is obscured.

Spleen: Size and appearance within normal limits.

Right Kidney: Length: 10.3 cm. Parenchymal echogenicity within
normal limits. Few echogenic foci in the upper and mid kidney
without definite posterior shadowing, possibly corresponding to
stones on prior CT. No mass or hydronephrosis visualized.

Left Kidney: Length: 11.7 cm. Echogenicity within normal limits.
Probable 6 mm stone in the mid upper left kidney. No mass or
hydronephrosis visualized.

Abdominal aorta: No aneurysm visualized.

Other findings: No free fluid or ascites.
IMPRESSION: 1. Gallbladder distension with borderline wall thickening. No
gallstones or additional findings of acute cholecystitis. Consider
further evaluation with nuclear medicine hepatic biliary scan.
2. There is a probable 5 mm gallbladder polyp. For a polyp of this
size no dedicated further imaging or follow-up is needed.
3. No biliary dilatation.
4. Hepatic steatosis.
5. Probable bilateral renal calculi. No hydronephrosis.

## 2022-09-15 ENCOUNTER — Ambulatory Visit (INDEPENDENT_AMBULATORY_CARE_PROVIDER_SITE_OTHER): Payer: BC Managed Care – PPO | Admitting: Internal Medicine

## 2022-09-15 ENCOUNTER — Encounter: Payer: Self-pay | Admitting: Internal Medicine

## 2022-09-15 VITALS — BP 130/78 | HR 91 | Temp 98.7°F | Ht 63.0 in | Wt 261.0 lb

## 2022-09-15 DIAGNOSIS — N200 Calculus of kidney: Secondary | ICD-10-CM

## 2022-09-15 DIAGNOSIS — M546 Pain in thoracic spine: Secondary | ICD-10-CM | POA: Diagnosis not present

## 2022-09-15 DIAGNOSIS — R109 Unspecified abdominal pain: Secondary | ICD-10-CM | POA: Diagnosis not present

## 2022-09-15 DIAGNOSIS — G5602 Carpal tunnel syndrome, left upper limb: Secondary | ICD-10-CM

## 2022-09-15 DIAGNOSIS — R1012 Left upper quadrant pain: Secondary | ICD-10-CM

## 2022-09-15 LAB — CBC WITH DIFFERENTIAL/PLATELET
Basophils Absolute: 0 10*3/uL (ref 0.0–0.1)
Basophils Relative: 0.5 % (ref 0.0–3.0)
Eosinophils Absolute: 0.1 10*3/uL (ref 0.0–0.7)
Eosinophils Relative: 2 % (ref 0.0–5.0)
HCT: 46.5 % — ABNORMAL HIGH (ref 36.0–46.0)
Hemoglobin: 15 g/dL (ref 12.0–15.0)
Lymphocytes Relative: 18.7 % (ref 12.0–46.0)
Lymphs Abs: 1.3 10*3/uL (ref 0.7–4.0)
MCHC: 32.3 g/dL (ref 30.0–36.0)
MCV: 89.1 fl (ref 78.0–100.0)
Monocytes Absolute: 0.5 10*3/uL (ref 0.1–1.0)
Monocytes Relative: 6.9 % (ref 3.0–12.0)
Neutro Abs: 5 10*3/uL (ref 1.4–7.7)
Neutrophils Relative %: 71.9 % (ref 43.0–77.0)
Platelets: 264 10*3/uL (ref 150.0–400.0)
RBC: 5.22 Mil/uL — ABNORMAL HIGH (ref 3.87–5.11)
RDW: 13.7 % (ref 11.5–15.5)
WBC: 7 10*3/uL (ref 4.0–10.5)

## 2022-09-15 LAB — HEPATIC FUNCTION PANEL
ALT: 23 U/L (ref 0–35)
AST: 19 U/L (ref 0–37)
Albumin: 4.4 g/dL (ref 3.5–5.2)
Alkaline Phosphatase: 68 U/L (ref 39–117)
Bilirubin, Direct: 0.3 mg/dL (ref 0.0–0.3)
Total Bilirubin: 1.2 mg/dL (ref 0.2–1.2)
Total Protein: 7.5 g/dL (ref 6.0–8.3)

## 2022-09-15 LAB — BASIC METABOLIC PANEL
BUN: 14 mg/dL (ref 6–23)
CO2: 30 mEq/L (ref 19–32)
Calcium: 9.5 mg/dL (ref 8.4–10.5)
Chloride: 101 mEq/L (ref 96–112)
Creatinine, Ser: 0.6 mg/dL (ref 0.40–1.20)
GFR: 103.72 mL/min (ref >60.00)
Glucose, Bld: 98 mg/dL (ref 70–99)
Potassium: 4.7 mEq/L (ref 3.5–5.1)
Sodium: 137 mEq/L (ref 135–145)

## 2022-09-15 LAB — URINALYSIS, ROUTINE W REFLEX MICROSCOPIC
Bilirubin Urine: NEGATIVE
Hgb urine dipstick: NEGATIVE
Ketones, ur: NEGATIVE
Nitrite: NEGATIVE
RBC / HPF: NONE SEEN (ref 0–?)
Specific Gravity, Urine: 1.01 (ref 1.000–1.030)
Total Protein, Urine: NEGATIVE
Urine Glucose: NEGATIVE
Urobilinogen, UA: 0.2 (ref 0.0–1.0)
pH: 7 (ref 5.0–8.0)

## 2022-09-15 LAB — LIPASE: Lipase: 20 U/L (ref 11.0–59.0)

## 2022-09-15 NOTE — Patient Instructions (Signed)
Please continue all other medications as before, and refills have been done if requested.  Please have the pharmacy call with any other refills you may need.  Please continue your efforts at being more active, low cholesterol diet, and weight control.  Please keep your appointments with your specialists as you may have planned  You will be contacted regarding the referral for: CT scan, and Neurosurgury  Please wear left wrist splint at night to help the carpal tunnel symptoms  Please go to the LAB at the blood drawing area for the tests to be done  You will be contacted by phone if any changes need to be made immediately.  Otherwise, you will receive a letter about your results with an explanation, but please check with MyChart first.  Please remember to sign up for MyChart if you have not done so, as this will be important to you in the future with finding out test results, communicating by private email, and scheduling acute appointments online when needed.    Darlene Lopez

## 2022-09-15 NOTE — Progress Notes (Unsigned)
Patient ID: Darlene Lopez, female   DOB: 1970/04/30, 52 y.o.   MRN: [id number]        Chief Complaint: follow up left flank pain, thoracic spine pain, hx of renal stones, left carpal tunnel syndrome       HPI:  Darlene Lopez is a 52 y.o. female here with known hx of lumbar DDD, and now with persistent and at least mild to mod midline thoracic spine pain and numbness with radicualr numbness and pain to left back, side and LUQ abdomen.  Pt also has hx of renal stones, and FH renal cancer, and has had mildly worsening more localized left flank pain in the last wk, and is quite concerned about more in the last 2 wks.  Also incidentally has about 1 mo onset worsening left hand numbness and discomfort worse in the AM, better later in the day.   Wt Readings from Last 3 Encounters:  09/15/22 261 lb (118.4 kg)  10/21/21 260 lb (117.9 kg)  09/20/20 260 lb (117.9 kg)   BP Readings from Last 3 Encounters:  09/15/22 130/78  10/21/21 126/76  09/20/20 128/80         Past Medical History:  Diagnosis Date   Chicken pox    Kidney stones    Past Surgical History:  Procedure Laterality Date   melanoma removal      reports that she has never smoked. She has never used smokeless tobacco. She reports that she does not drink alcohol and does not use drugs. family history includes Lung cancer in her paternal grandmother; Prostate cancer in her father. No Known Allergies Current Outpatient Medications on File Prior to Visit  Medication Sig Dispense Refill   SUMAtriptan (IMITREX) 100 MG tablet Take 1 tablet (100 mg total) by mouth every 2 (two) hours as needed for migraine or headache. May repeat in 2 hours if headache persists or recurs. 10 tablet 5   No current facility-administered medications on file prior to visit.        ROS:  All others reviewed and negative.  Objective        PE:  BP 130/78 (BP Location: Right Arm, Patient Position: Sitting, Cuff Size: Normal)   Pulse 91   Temp 98.7 F  (37.1 C) (Oral)   Ht 5\' 3"  (1.6 m)   Wt 261 lb (118.4 kg)   SpO2 97%   BMI 46.23 kg/m                 Constitutional: Pt appears in NAD               HENT: Head: NCAT.                Right Ear: External ear normal.                 Left Ear: External ear normal.                Eyes: . Pupils are equal, round, and reactive to light. Conjunctivae and EOM are normal               Nose: without d/c or deformity               Neck: Neck supple. Gross normal ROM               Cardiovascular: Normal rate and regular rhythm.                 Pulmonary/Chest: Effort  normal and breath sounds without rales or wheezing.                Abd:  Soft, NT, ND, + BS, no organomegaly, with mild discomfort to palpate left flank, no rash               Neurological: Pt is alert. At baseline orientation, motor grossly intact               Skin: Skin is warm. No rashes, no other new lesions, LE edema - none               Psychiatric: Pt behavior is normal without agitation   Micro: none  Cardiac tracings I have personally interpreted today:  none  Pertinent Radiological findings (summarize): none   Lab Results  Component Value Date   WBC 7.0 09/15/2022   HGB 15.0 09/15/2022   HCT 46.5 (H) 09/15/2022   PLT 264.0 09/15/2022   GLUCOSE 98 09/15/2022   CHOL 214 (H) 10/21/2021   TRIG 108.0 10/21/2021   HDL 46.50 10/21/2021   LDLDIRECT 150.0 08/01/2008   LDLCALC 146 (H) 10/21/2021   ALT 23 09/15/2022   AST 19 09/15/2022   NA 137 09/15/2022   K 4.7 09/15/2022   CL 101 09/15/2022   CREATININE 0.60 09/15/2022   BUN 14 09/15/2022   CO2 30 09/15/2022   TSH 2.43 10/21/2021   HGBA1C 6.0 10/21/2021   MICROALBUR <0.7 10/21/2021   Assessment/Plan:  Darlene Lopez is a 52 y.o. White or Caucasian [1] female with  has a past medical history of Chicken pox and Kidney stones.  Left carpal tunnel syndrome Mild to mod, incidental it seems, advised for left wrist splint at night, also refer NS, to f/u any  worsening symptoms or concerns   Left flank pain Etiology unclear, for CT renal r/o renal stone recurrent since her 20's  Renal stones Also for UA with labs, consider urology referral  Thoracic spine pain By hx I suspect she may have left sided neuritic pain and numbness possibly about t10 level, might benefit from thoracic MRI to further evaluate  Followup: Return if symptoms worsen or fail to improve.  Oliver Barre, MD 09/16/2022 1:07 PM [facility] [place] [facility]

## 2022-09-15 NOTE — Progress Notes (Signed)
The test results show that your current treatment is OK, as the tests are stable.  Please continue the same plan.  There is no other need for change of treatment or further evaluation based on these results, at this time.  thanks 

## 2022-09-16 ENCOUNTER — Encounter: Payer: Self-pay | Admitting: Internal Medicine

## 2022-09-16 NOTE — Assessment & Plan Note (Signed)
Etiology unclear, for CT renal r/o renal stone recurrent since her 20's

## 2022-09-16 NOTE — Assessment & Plan Note (Signed)
By hx I suspect she may have left sided neuritic pain and numbness possibly about t10 level, might benefit from thoracic MRI to further evaluate

## 2022-09-16 NOTE — Assessment & Plan Note (Signed)
Also for UA with labs, consider urology referral

## 2022-09-16 NOTE — Assessment & Plan Note (Signed)
Mild to mod, incidental it seems, advised for left wrist splint at night, also refer NS, to f/u any worsening symptoms or concerns

## 2022-09-23 ENCOUNTER — Telehealth: Payer: Self-pay | Admitting: Internal Medicine

## 2022-09-23 ENCOUNTER — Encounter (INDEPENDENT_AMBULATORY_CARE_PROVIDER_SITE_OTHER): Payer: Self-pay

## 2022-09-23 DIAGNOSIS — M546 Pain in thoracic spine: Secondary | ICD-10-CM

## 2022-09-23 NOTE — Telephone Encounter (Signed)
Ok I have ordered the mri for the thoracic spine  There is no imaging needed for the carpal tunnel issue   thanks

## 2022-09-23 NOTE — Telephone Encounter (Signed)
Called pt no answer LMOM w/MD response../lmb 

## 2022-09-23 NOTE — Telephone Encounter (Signed)
See below

## 2022-09-23 NOTE — Telephone Encounter (Signed)
Patient was referred to neurosurgery for her lumbar spine and carpal tunnel. They called her and said her PCP would need to get imaging done for patient of those areas before they can get her scheduled. Best callback is [phone].

## 2022-09-30 ENCOUNTER — Ambulatory Visit
Admission: RE | Admit: 2022-09-30 | Discharge: 2022-09-30 | Disposition: A | Discharge status: Home / Self Care | Payer: BC Managed Care – PPO | Source: Ambulatory Visit | Attending: Internal Medicine | Admitting: Internal Medicine

## 2022-09-30 DIAGNOSIS — K5790 Diverticulosis of intestine, part unspecified, without perforation or abscess without bleeding: Secondary | ICD-10-CM | POA: Diagnosis not present

## 2022-09-30 DIAGNOSIS — R1012 Left upper quadrant pain: Secondary | ICD-10-CM | POA: Diagnosis not present

## 2022-09-30 DIAGNOSIS — K760 Fatty (change of) liver, not elsewhere classified: Secondary | ICD-10-CM | POA: Diagnosis not present

## 2022-10-01 ENCOUNTER — Ambulatory Visit
Admission: RE | Admit: 2022-10-01 | Discharge: 2022-10-01 | Disposition: A | Discharge status: Home / Self Care | Payer: BC Managed Care – PPO | Source: Ambulatory Visit | Attending: Internal Medicine | Admitting: Internal Medicine

## 2022-10-01 DIAGNOSIS — M546 Pain in thoracic spine: Secondary | ICD-10-CM

## 2022-10-04 ENCOUNTER — Other Ambulatory Visit: Payer: BC Managed Care – PPO

## 2022-10-05 ENCOUNTER — Other Ambulatory Visit: Payer: BC Managed Care – PPO

## 2022-10-05 DIAGNOSIS — M546 Pain in thoracic spine: Secondary | ICD-10-CM | POA: Diagnosis not present

## 2022-10-07 ENCOUNTER — Telehealth: Payer: Self-pay | Admitting: Internal Medicine

## 2022-10-07 DIAGNOSIS — N200 Calculus of kidney: Secondary | ICD-10-CM

## 2022-10-07 NOTE — Telephone Encounter (Signed)
The best we can say now is to drink plenty of fluids, but there is just no other way to dissolve the stones or prevent them in a proactive way from causing a problem in the future;  I can refer to Urology if she wants, thanks

## 2022-10-07 NOTE — Telephone Encounter (Signed)
Notified pt w/MD response. Pt states she would like to see a urologist../lmb

## 2022-10-07 NOTE — Telephone Encounter (Signed)
Ok this is done 

## 2022-10-07 NOTE — Telephone Encounter (Signed)
Patient saw the results for the renal stone CT, but she is concerned that the stones she has will pose an issue. She is aware of the note Dr. Jonny Ruiz put about there being no obstruction, but she wanted to know if there is anything she should do. She said she has an upcoming trip and doesn't want to worry while she's out of town. Patient would like a call back at [phone].

## 2022-10-12 ENCOUNTER — Telehealth: Payer: Self-pay | Admitting: Internal Medicine

## 2022-10-12 NOTE — Telephone Encounter (Signed)
Patient has an appointment with a neurosurgeon tomorrow 10/13/22 at [facility]. She had imaging done of her thoracic spine and would like to make sure that gets sent to the neurosurgeon. She would like a call back to make sure it gets sent. Best callback is [phone].

## 2022-10-13 DIAGNOSIS — G5603 Carpal tunnel syndrome, bilateral upper limbs: Secondary | ICD-10-CM | POA: Diagnosis not present

## 2022-10-14 NOTE — Telephone Encounter (Signed)
Printed both report and faxed to [phone].Marland KitchenRaechel Chute

## 2022-10-21 ENCOUNTER — Ambulatory Visit: Payer: BC Managed Care – PPO | Admitting: Urology

## 2022-10-21 ENCOUNTER — Encounter: Payer: Self-pay | Admitting: Urology

## 2022-10-21 VITALS — BP 118/75 | HR 84 | Ht 63.0 in | Wt 260.0 lb

## 2022-10-21 DIAGNOSIS — N200 Calculus of kidney: Secondary | ICD-10-CM | POA: Diagnosis not present

## 2022-10-21 NOTE — Progress Notes (Addendum)
Assessment: 1. Nephrolithiasis, left     Plan: I personally reviewed the patient's chart including provider notes, lab and imaging results. I personally reviewed the CT study from 09/30/2022 with results as noted below. I have asked her that given the findings on CT and no evidence of obstruction, I do not think that the left renal calculus is the cause for her back pain.  Her back pain may be more likely due to her degenerative disc disease. I discussed treatment of the left renal calculus with shockwave lithotripsy, ureteroscopic laser lithotripsy, and PCNL. She is interested in treatment of the stone but would like to wait until she returns from her traveling in early November. Stone prevention discussed and information provided. Return to office in early November 2024 with KUB and to discuss treatment with shockwave lithotripsy.  Chief Complaint:  Chief Complaint  Patient presents with   Nephrolithiasis    History of Present Illness:  Darlene Lopez is a 52 y.o. female who is seen in consultation from Corwin Levins, MD for evaluation of nephrolithiasis.  She reports a 6-week history of intermittent left-sided back and flank pain.  She reports this as an achy discomfort.  No associated nausea, vomiting, fever, chills.  No dysuria or gross hematuria. She was evaluated with a CT abdomen and pelvis without contrast on 09/30/2022 for left-sided flank pain.  This study showed no evidence of hydronephrosis, bilateral intrarenal calculi with a 10 mm nonobstructing stone in the mid upper left kidney.  She has a prior history of kidney stones passing several stones since age 19.  She has previously seen Dr. Tera Partridge in [place].  She has had a prior ureteral stent for management of her stones.  Past Medical History:  Past Medical History:  Diagnosis Date   Chicken pox    Kidney stones     Past Surgical History:  Past Surgical History:  Procedure Laterality Date   melanoma removal       Allergies:  No Known Allergies  Family History:  Family History  Problem Relation Age of Onset   Prostate cancer Father    Lung cancer Paternal Grandmother     Social History:  Social History   Tobacco Use   Smoking status: Never   Smokeless tobacco: Never  Substance Use Topics   Alcohol use: No    Alcohol/week: 0.0 standard drinks of alcohol   Drug use: No    Review of symptoms:  Constitutional:  Negative for unexplained weight loss, night sweats, fever, chills ENT:  Negative for nose bleeds, sinus pain, painful swallowing CV:  Negative for chest pain, shortness of breath, exercise intolerance, palpitations, loss of consciousness Resp:  Negative for cough, wheezing, shortness of breath GI:  Negative for nausea, vomiting, diarrhea, bloody stools GU:  Positives noted in HPI; otherwise negative for gross hematuria, dysuria, urinary incontinence Neuro:  Negative for seizures, poor balance, limb weakness, slurred speech Psych:  Negative for lack of energy, depression, anxiety Endocrine:  Negative for polydipsia, polyuria, symptoms of hypoglycemia (dizziness, hunger, sweating) Hematologic:  Negative for anemia, purpura, petechia, prolonged or excessive bleeding, use of anticoagulants  Allergic:  Negative for difficulty breathing or choking as a result of exposure to anything; no shellfish allergy; no allergic response (rash/itch) to materials, foods  Physical exam: BP 118/75   Pulse 84   Ht 5\' 3"  (1.6 m)   Wt 260 lb (117.9 kg)   BMI 46.06 kg/m  GENERAL APPEARANCE:  Well appearing, well developed, well nourished,  NAD HEENT: Atraumatic, Normocephalic, oropharynx clear. NECK: Supple without lymphadenopathy or thyromegaly. LUNGS: Clear to auscultation bilaterally. HEART: Regular Rate and Rhythm without murmurs, gallops, or rubs. ABDOMEN: Soft, non-tender, No Masses. EXTREMITIES: Moves all extremities well.  Without clubbing, cyanosis, or edema. NEUROLOGIC:  Alert and  oriented x 3, normal gait, CN II-XII grossly intact.  MENTAL STATUS:  Appropriate. BACK:  Non-tender to palpation.  No CVAT SKIN:  Warm, dry and intact.    Results: U/A: Negative

## 2022-10-23 ENCOUNTER — Ambulatory Visit: Payer: BC Managed Care – PPO | Admitting: Internal Medicine

## 2022-10-23 ENCOUNTER — Encounter: Payer: Self-pay | Admitting: Internal Medicine

## 2022-10-23 ENCOUNTER — Other Ambulatory Visit: Payer: Self-pay | Admitting: Internal Medicine

## 2022-10-23 VITALS — BP 120/74 | HR 78 | Temp 98.3°F | Ht 63.0 in | Wt 262.0 lb

## 2022-10-23 DIAGNOSIS — Z1231 Encounter for screening mammogram for malignant neoplasm of breast: Secondary | ICD-10-CM

## 2022-10-23 DIAGNOSIS — E538 Deficiency of other specified B group vitamins: Secondary | ICD-10-CM

## 2022-10-23 DIAGNOSIS — R739 Hyperglycemia, unspecified: Secondary | ICD-10-CM | POA: Diagnosis not present

## 2022-10-23 DIAGNOSIS — Z0001 Encounter for general adult medical examination with abnormal findings: Secondary | ICD-10-CM

## 2022-10-23 DIAGNOSIS — Z0000 Encounter for general adult medical examination without abnormal findings: Secondary | ICD-10-CM | POA: Diagnosis not present

## 2022-10-23 DIAGNOSIS — G43009 Migraine without aura, not intractable, without status migrainosus: Secondary | ICD-10-CM

## 2022-10-23 DIAGNOSIS — E559 Vitamin D deficiency, unspecified: Secondary | ICD-10-CM | POA: Diagnosis not present

## 2022-10-23 DIAGNOSIS — Z6841 Body Mass Index (BMI) 40.0 and over, adult: Secondary | ICD-10-CM

## 2022-10-23 DIAGNOSIS — E7800 Pure hypercholesterolemia, unspecified: Secondary | ICD-10-CM | POA: Diagnosis not present

## 2022-10-23 DIAGNOSIS — K219 Gastro-esophageal reflux disease without esophagitis: Secondary | ICD-10-CM

## 2022-10-23 DIAGNOSIS — E66813 Obesity, class 3: Secondary | ICD-10-CM

## 2022-10-23 LAB — CBC WITH DIFFERENTIAL/PLATELET
Basophils Absolute: 0 10*3/uL (ref 0.0–0.1)
Basophils Relative: 0.6 % (ref 0.0–3.0)
Eosinophils Absolute: 0.2 10*3/uL (ref 0.0–0.7)
Eosinophils Relative: 3.2 % (ref 0.0–5.0)
HCT: 46.2 % — ABNORMAL HIGH (ref 36.0–46.0)
Hemoglobin: 15.2 g/dL — ABNORMAL HIGH (ref 12.0–15.0)
Lymphocytes Relative: 24.2 % (ref 12.0–46.0)
Lymphs Abs: 1.4 10*3/uL (ref 0.7–4.0)
MCHC: 32.9 g/dL (ref 30.0–36.0)
MCV: 87.6 fl (ref 78.0–100.0)
Monocytes Absolute: 0.5 10*3/uL (ref 0.1–1.0)
Monocytes Relative: 8.8 % (ref 3.0–12.0)
Neutro Abs: 3.7 10*3/uL (ref 1.4–7.7)
Neutrophils Relative %: 63.2 % (ref 43.0–77.0)
Platelets: 224 10*3/uL (ref 150.0–400.0)
RBC: 5.27 Mil/uL — ABNORMAL HIGH (ref 3.87–5.11)
RDW: 14 % (ref 11.5–15.5)
WBC: 5.9 10*3/uL (ref 4.0–10.5)

## 2022-10-23 LAB — LIPID PANEL
Cholesterol: 201 mg/dL — ABNORMAL HIGH (ref 0–200)
HDL: 46.3 mg/dL (ref >39.00)
LDL Cholesterol: 128 mg/dL — ABNORMAL HIGH (ref 0–99)
NonHDL: 154.84
Total CHOL/HDL Ratio: 4
Triglycerides: 136 mg/dL (ref 0.0–149.0)
VLDL: 27.2 mg/dL (ref 0.0–40.0)

## 2022-10-23 LAB — URINALYSIS, ROUTINE W REFLEX MICROSCOPIC
Bilirubin Urine: NEGATIVE
Hgb urine dipstick: NEGATIVE
Ketones, ur: NEGATIVE
Leukocytes,Ua: NEGATIVE
Nitrite: NEGATIVE
RBC / HPF: NONE SEEN (ref 0–?)
Specific Gravity, Urine: 1.015 (ref 1.000–1.030)
Total Protein, Urine: NEGATIVE
Urine Glucose: NEGATIVE
Urobilinogen, UA: 0.2 (ref 0.0–1.0)
pH: 7 (ref 5.0–8.0)

## 2022-10-23 LAB — TSH: TSH: 4.79 u[IU]/mL (ref 0.35–5.50)

## 2022-10-23 LAB — HEPATIC FUNCTION PANEL
ALT: 27 U/L (ref 0–35)
AST: 20 U/L (ref 0–37)
Albumin: 4.1 g/dL (ref 3.5–5.2)
Alkaline Phosphatase: 69 U/L (ref 39–117)
Bilirubin, Direct: 0.2 mg/dL (ref 0.0–0.3)
Total Bilirubin: 1.3 mg/dL — ABNORMAL HIGH (ref 0.2–1.2)
Total Protein: 7.1 g/dL (ref 6.0–8.3)

## 2022-10-23 LAB — HEMOGLOBIN A1C: Hgb A1c MFr Bld: 6 % (ref 4.6–6.5)

## 2022-10-23 LAB — VITAMIN B12: Vitamin B-12: 178 pg/mL — ABNORMAL LOW (ref 211–911)

## 2022-10-23 LAB — MICROALBUMIN / CREATININE URINE RATIO
Creatinine,U: 49.3 mg/dL
Microalb Creat Ratio: 1.4 mg/g (ref 0.0–30.0)
Microalb, Ur: <0.7 mg/dL (ref 0.0–1.9)

## 2022-10-23 LAB — BASIC METABOLIC PANEL
BUN: 14 mg/dL (ref 6–23)
CO2: 30 mEq/L (ref 19–32)
Calcium: 9.6 mg/dL (ref 8.4–10.5)
Chloride: 100 mEq/L (ref 96–112)
Creatinine, Ser: 0.6 mg/dL (ref 0.40–1.20)
GFR: 103.65 mL/min (ref >60.00)
Glucose, Bld: 96 mg/dL (ref 70–99)
Potassium: 4.4 mEq/L (ref 3.5–5.1)
Sodium: 139 mEq/L (ref 135–145)

## 2022-10-23 LAB — VITAMIN D 25 HYDROXY (VIT D DEFICIENCY, FRACTURES): VITD: 65.07 ng/mL (ref 30.00–100.00)

## 2022-10-23 MED ORDER — NURTEC 75 MG PO TBDP: ORAL_TABLET | ORAL | 5 refills | Status: DC

## 2022-10-23 MED ORDER — ROSUVASTATIN CALCIUM 10 MG PO TABS: 10.0000 mg | ORAL_TABLET | Freq: Every day | ORAL | 3 refills | Status: DC

## 2022-10-23 NOTE — Assessment & Plan Note (Signed)
Lab Results  Component Value Date   LDLCALC 146 (H) 10/21/2021   Uncontrolled, cont low chol diet, consider statin after check lab today, goal ldl < 100    Current Outpatient Medications (Analgesics):    Rimegepant Sulfate (NURTEC) 75 MG TBDP, 1 tab by mouth once daily as needed   SUMAtriptan (IMITREX) 100 MG tablet, Take 1 tablet (100 mg total) by mouth every 2 (two) hours as needed for migraine or headache. May repeat in 2 hours if headache persists or recurs.

## 2022-10-23 NOTE — Assessment & Plan Note (Signed)
Not as well controlled recently, for trial nurtec 75 every day prn

## 2022-10-23 NOTE — Assessment & Plan Note (Signed)
Lab Results  Component Value Date   HGBA1C 6.0 10/21/2021   Stable, pt to continue current medical treatment  - diet, wt control

## 2022-10-23 NOTE — Progress Notes (Signed)
Patient ID: Darlene Lopez, female   DOB: February 02, 1971, 52 y.o.   MRN: [id number]         Chief Complaint:: wellness exam and migraine, hld, hyperglycemia, obesity, gerd       HPI:  Darlene Lopez is a 52 y.o. female here for wellness exam;  for shingrix at pharmacy, declines covid booster or flu shot, pt plans to call GYN for pap soon, due for mammogram o/w up to date.                 Also did see Dr Pete Glatter urology who noted her renal stone and d/w her options of leaving alone vs lithotripsy.  Pt denies chest pain, increased sob or doe, wheezing, orthopnea, PND, increased LE swelling, palpitations, dizziness or syncope.   Pt denies polydipsia, polyuria, or new focal neuro s/s.    Pt denies fever, wt loss, night sweats, loss of appetite, or other constitutional symptoms  Denies worsening depressive symptoms, suicidal ideation, or panic; has ongoing anxiety, Imitrex not working as well recently for migraine.  Difficult to lose wt but plans to be more active soon  Denies worsening reflux, abd pain, dysphagia, n/v, bowel change or blood.     Wt Readings from Last 3 Encounters:  10/23/22 262 lb (118.8 kg)  10/21/22 260 lb (117.9 kg)  09/15/22 261 lb (118.4 kg)   BP Readings from Last 3 Encounters:  10/23/22 120/74  10/21/22 118/75  09/15/22 130/78   Immunization History  Administered Date(s) Administered   Influenza Split 03/02/2012   Td 02/23/2006   Tdap 10/08/2016   Health Maintenance Due  Topic Date Due   COVID-19 Vaccine (1) Never done   Zoster Vaccines- Shingrix (1 of 2) Never done   PAP SMEAR-Modifier  08/23/2010   MAMMOGRAM  Never done      Past Medical History:  Diagnosis Date   Chicken pox    Kidney stones    Past Surgical History:  Procedure Laterality Date   melanoma removal      reports that she has never smoked. She has never used smokeless tobacco. She reports that she does not drink alcohol and does not use drugs. family history includes Lung cancer in her  paternal grandmother; Prostate cancer in her father. No Known Allergies Current Outpatient Medications on File Prior to Visit  Medication Sig Dispense Refill   SUMAtriptan (IMITREX) 100 MG tablet Take 1 tablet (100 mg total) by mouth every 2 (two) hours as needed for migraine or headache. May repeat in 2 hours if headache persists or recurs. 10 tablet 5   No current facility-administered medications on file prior to visit.        ROS:  All others reviewed and negative.  Objective        PE:  BP 120/74 (BP Location: Right Arm, Patient Position: Sitting, Cuff Size: Normal)   Pulse 78   Temp 98.3 F (36.8 C) (Oral)   Ht 5\' 3"  (1.6 m)   Wt 262 lb (118.8 kg)   SpO2 97%   BMI 46.41 kg/m                 Constitutional: Pt appears in NAD               HENT: Head: NCAT.                Right Ear: External ear normal.  Left Ear: External ear normal.                Eyes: . Pupils are equal, round, and reactive to light. Conjunctivae and EOM are normal               Nose: without d/c or deformity               Neck: Neck supple. Gross normal ROM               Cardiovascular: Normal rate and regular rhythm.                 Pulmonary/Chest: Effort normal and breath sounds without rales or wheezing.                Abd:  Soft, NT, ND, + BS, no organomegaly               Neurological: Pt is alert. At baseline orientation, motor grossly intact               Skin: Skin is warm. No rashes, no other new lesions, LE edema - none               Psychiatric: Pt behavior is normal without agitation , mod nervous  Micro: none  Cardiac tracings I have personally interpreted today:  none  Pertinent Radiological findings (summarize): none   Lab Results  Component Value Date   WBC 7.0 09/15/2022   HGB 15.0 09/15/2022   HCT 46.5 (H) 09/15/2022   PLT 264.0 09/15/2022   GLUCOSE 98 09/15/2022   CHOL 214 (H) 10/21/2021   TRIG 108.0 10/21/2021   HDL 46.50 10/21/2021   LDLDIRECT 150.0  08/01/2008   LDLCALC 146 (H) 10/21/2021   ALT 23 09/15/2022   AST 19 09/15/2022   NA 137 09/15/2022   K 4.7 09/15/2022   CL 101 09/15/2022   CREATININE 0.60 09/15/2022   BUN 14 09/15/2022   CO2 30 09/15/2022   TSH 2.43 10/21/2021   HGBA1C 6.0 10/21/2021   MICROALBUR <0.7 10/21/2021   Assessment/Plan:  Darlene Lopez is a 52 y.o. White or Caucasian [1] female with  has a past medical history of Chicken pox and Kidney stones.  Encounter for well adult exam with abnormal findings Age and sex appropriate education and counseling updated with regular exercise and diet Referrals for preventative services - pt will call for GYN appt, ok for mammogram Immunizations addressed - declines flu shot and covid booster, for shingrx at pharmacy Smoking counseling  - none needed Evidence for depression or other mood disorder - chronic stable anxiety Most recent labs reviewed. I have personally reviewed and have noted: 1) the patient's medical and social history 2) The patient's current medications and supplements 3) The patient's height, weight, and BMI have been recorded in the chart   GERD Stable overall despite wt increase, continue anti reflux diet  HLD (hyperlipidemia) Lab Results  Component Value Date   LDLCALC 146 (H) 10/21/2021   Uncontrolled, cont low chol diet, consider statin after check lab today, goal ldl < 100    Current Outpatient Medications (Analgesics):    Rimegepant Sulfate (NURTEC) 75 MG TBDP, 1 tab by mouth once daily as needed   SUMAtriptan (IMITREX) 100 MG tablet, Take 1 tablet (100 mg total) by mouth every 2 (two) hours as needed for migraine or headache. May repeat in 2 hours if headache persists or recurs.     Hyperglycemia Lab Results  Component Value Date   HGBA1C 6.0 10/21/2021   Stable, pt to continue current medical treatment  - diet, wt control  Migraine without aura and without status migrainosus, not intractable Not as well controlled  recently, for trial nurtec 75 every day prn   OBESITY Pt declines wegovy for now, for contd diet and wt control effort  B12 deficiency Lab Results  Component Value Date   VITAMINB12 230 10/21/2021   Borderline low, for oral replacement - b12 1000 mcg every day., f/u lab today  Followup: Return in about 1 year (around 10/23/2023).  Oliver Barre, MD 10/23/2022 10:47 AM [facility] [place] [facility] - [facility]

## 2022-10-23 NOTE — Assessment & Plan Note (Signed)
Lab Results  Component Value Date   VITAMINB12 230 10/21/2021   Borderline low, for oral replacement - b12 1000 mcg every day., f/u lab today

## 2022-10-23 NOTE — Assessment & Plan Note (Signed)
Age and sex appropriate education and counseling updated with regular exercise and diet Referrals for preventative services - pt will call for GYN appt, ok for mammogram Immunizations addressed - declines flu shot and covid booster, for shingrx at pharmacy Smoking counseling  - none needed Evidence for depression or other mood disorder - chronic stable anxiety Most recent labs reviewed. I have personally reviewed and have noted: 1) the patient's medical and social history 2) The patient's current medications and supplements 3) The patient's height, weight, and BMI have been recorded in the chart

## 2022-10-23 NOTE — Assessment & Plan Note (Signed)
Pt declines wegovy for now, for contd diet and wt control effort

## 2022-10-23 NOTE — Assessment & Plan Note (Signed)
Stable overall despite wt increase, continue anti reflux diet

## 2022-10-23 NOTE — Patient Instructions (Addendum)
Please have your Shingrix (shingles) shots done at your local pharmacy.  Ok to try change imitrex to nurtec as directed  Please continue all other medications as before, and refills have been done if requested.  Please have the pharmacy call with any other refills you may need.  Please continue your efforts at being more active, low cholesterol diet, and weight control.  You are otherwise up to date with prevention measures today.  Please keep your appointments with your specialists as you may have planned  You will be contacted regarding the referral for: mammogram  Please go to the LAB at the blood drawing area for the tests to be done  You will be contacted by phone if any changes need to be made immediately.  Otherwise, you will receive a letter about your results with an explanation, but please check with MyChart first.  Please make an Appointment to return for your 1 year visit, or sooner if needed

## 2022-10-30 ENCOUNTER — Telehealth: Payer: Self-pay

## 2022-10-30 LAB — URINALYSIS, ROUTINE W REFLEX MICROSCOPIC
Bilirubin, UA: NEGATIVE
Glucose, UA: NEGATIVE
Ketones, UA: NEGATIVE
Leukocytes,UA: NEGATIVE
Nitrite, UA: NEGATIVE
Protein,UA: NEGATIVE
RBC, UA: NEGATIVE
Specific Gravity, UA: 1.02 (ref 1.005–1.030)
Urobilinogen, Ur: 0.2 mg/dL (ref 0.2–1.0)
pH, UA: 6.5 (ref 5.0–7.5)

## 2022-10-30 NOTE — Telephone Encounter (Signed)
Pharmacy Patient Advocate Encounter   Received notification from CoverMyMeds that prior authorization for Nurtec 75MG  dispersible tablets is required/requested.   Insurance verification completed.   The patient is insured through [facility] .   Per test claim: PA required; PA submitted to [employer] via CoverMyMeds Key/confirmation #/EOC [id number] Status is pending

## 2022-11-02 NOTE — Telephone Encounter (Addendum)
Pharmacy Patient Advocate Encounter  Received notification from [facility] that Prior Authorization for Nurtec 75MG  dispersible tablets  has been DENIED.  Full denial letter will be uploaded to the media tab. See denial reason below.   PA #/Case ID/Reference #: [id number]   Denial Reason: Must try and fail Bernita Raisin

## 2022-11-04 MED ORDER — UBRELVY 100 MG PO TABS: ORAL_TABLET | ORAL | 5 refills | Status: DC

## 2022-11-04 NOTE — Telephone Encounter (Signed)
Called and left voicemail, letting Pt know.

## 2022-11-04 NOTE — Telephone Encounter (Signed)
Ok for change to [place] as this is preferred by her insurance  Ok to let pt know

## 2022-11-04 NOTE — Addendum Note (Signed)
Addended by: Corwin Levins on: 11/04/2022 01:05 PM   Modules accepted: Orders

## 2022-11-16 ENCOUNTER — Other Ambulatory Visit (HOSPITAL_COMMUNITY): Payer: Self-pay

## 2022-11-16 ENCOUNTER — Telehealth: Payer: Self-pay | Admitting: Pharmacy Technician

## 2022-11-16 NOTE — Telephone Encounter (Signed)
Pharmacy Patient Advocate Encounter   Received notification from CoverMyMeds that prior authorization for Ubrelvy 100MG  tablets is required/requested.   Insurance verification completed.   The patient is insured through [facility] .   Per test claim: PA required; PA started via CoverMyMeds. KEY [id number] . Waiting for clinical questions to populate.

## 2022-11-17 NOTE — Telephone Encounter (Signed)
 Clinical questions answered and PA submitted

## 2022-11-18 MED ORDER — UBRELVY 100 MG PO TABS: ORAL_TABLET | ORAL | 5 refills | Status: DC

## 2022-11-18 NOTE — Telephone Encounter (Signed)
Pharmacy Patient Advocate Encounter  Received notification from [facility] that Prior Authorization for Ubrelvy 100mg  has been DENIED.  Full denial letter will be uploaded to the media tab. See denial reason below.   PA #/Case ID/Reference #: [id number]

## 2022-11-18 NOTE — Addendum Note (Signed)
Addended by: Corwin Levins on: 11/18/2022 10:28 AM   Modules accepted: Orders

## 2022-12-29 ENCOUNTER — Other Ambulatory Visit: Payer: Self-pay

## 2022-12-29 DIAGNOSIS — N200 Calculus of kidney: Secondary | ICD-10-CM

## 2022-12-30 ENCOUNTER — Ambulatory Visit: Payer: Self-pay | Admitting: Urology

## 2022-12-30 ENCOUNTER — Encounter: Payer: Self-pay | Admitting: Urology

## 2022-12-30 ENCOUNTER — Ambulatory Visit: Payer: BC Managed Care – PPO | Admitting: Urology

## 2022-12-30 ENCOUNTER — Ambulatory Visit (HOSPITAL_BASED_OUTPATIENT_CLINIC_OR_DEPARTMENT_OTHER)
Admission: RE | Admit: 2022-12-30 | Discharge: 2022-12-30 | Disposition: A | Discharge status: Home / Self Care | Payer: BC Managed Care – PPO | Source: Ambulatory Visit | Attending: Urology | Admitting: Urology

## 2022-12-30 VITALS — BP 124/76 | HR 90 | Ht 63.0 in | Wt 255.0 lb

## 2022-12-30 DIAGNOSIS — R109 Unspecified abdominal pain: Secondary | ICD-10-CM | POA: Diagnosis not present

## 2022-12-30 DIAGNOSIS — N200 Calculus of kidney: Secondary | ICD-10-CM

## 2022-12-30 NOTE — H&P (View-Only) (Signed)
 Assessment: 1. Nephrolithiasis     Plan: I personally reviewed the KUB study from today which shows a 10 x 4 mm calcification in the left renal shadow. I again discussed treatment options for the left renal calculus including observation, shockwave lithotripsy, ureteroscopic laser lithotripsy, and PCNL. She would like to proceed with left shockwave lithotripsy in the near future. Risk of the procedure discussed with the patient in detail.  Procedure: The patient will be scheduled for left ESL at [facility].  Surgical request is placed with the surgery schedulers and will be scheduled at the patient's/family request. Informed consent is given as documented below. Anesthesia:  local  The patient does not have sleep apnea, history of MRSA, history of VRE, history of cardiac device requiring special anesthetic needs. Patient is stable and considered clear for surgical in an outpatient ambulatory surgery setting as well as patient hospital setting.  Consent for Operation or Procedure: Provider Certification I hereby certify that the nature, purpose, benefits, usual and most frequent risks of, and alternatives to, the operation or procedure have been explained to the patient (or person authorized to sign for the patient) either by me as responsible physician or by the provider who is to perform the operation or procedure. Time spent such that the patient/family has had an opportunity to ask questions, and that those questions have been answered. The patient or the patient's representative has been advised that selected tasks may be performed by assistants to the primary health care provider(s). I believe that the patient (or person authorized to sign for the patient) understands what has been explained, and has consented to the operation or procedure. No guarantees were implied or made.  Chief Complaint:  Chief Complaint  Patient presents with   Nephrolithiasis    History of  Present Illness:  Darlene Lopez is a 52 y.o. female who is seen for evaluation of nephrolithiasis.   At her initial visit in August 2024, she reported a 6-week history of intermittent left-sided back and flank pain.  No associated nausea, vomiting, fever, chills.  No dysuria or gross hematuria. She was evaluated with a CT abdomen and pelvis without contrast on 09/30/2022 for left-sided flank pain.  This study showed no evidence of hydronephrosis, bilateral intrarenal calculi with a 10 mm nonobstructing stone in the mid upper left kidney.  She has a prior history of kidney stones passing several stones since age 44.  She has previously seen Dr. Tera Partridge in [employer].  She has had a prior ureteral stent for management of her stones.  Portions of the above documentation were copied from a prior visit for review purposes only.   Past Medical History:  Past Medical History:  Diagnosis Date   Chicken pox    Kidney stones     Past Surgical History:  Past Surgical History:  Procedure Laterality Date   melanoma removal      Allergies:  No Known Allergies  Family History:  Family History  Problem Relation Age of Onset   Prostate cancer Father    Lung cancer Paternal Grandmother     Social History:  Social History   Tobacco Use   Smoking status: Never   Smokeless tobacco: Never  Substance Use Topics   Alcohol use: No    Alcohol/week: 0.0 standard drinks of alcohol   Drug use: No    ROS: Constitutional:  Negative for fever, chills, weight loss CV: Negative for chest pain, previous MI, hypertension Respiratory:  Negative for shortness  of breath, wheezing, sleep apnea, frequent cough GI:  Negative for nausea, vomiting, bloody stool, GERD  Physical exam: BP 124/76   Pulse 90   Ht 5\' 3"  (1.6 m)   Wt 255 lb (115.7 kg)   BMI 45.17 kg/m  GENERAL APPEARANCE:  Well appearing, well developed, well nourished, NAD HEENT:  Atraumatic, normocephalic, oropharynx clear NECK:   Supple without lymphadenopathy or thyromegaly ABDOMEN:  Soft, non-tender, no masses EXTREMITIES:  Moves all extremities well, without clubbing, cyanosis, or edema NEUROLOGIC:  Alert and oriented x 3, normal gait, CN II-XII grossly intact MENTAL STATUS:  appropriate BACK:  Non-tender to palpation, No CVAT SKIN:  Warm, dry, and intact  Results: U/A: negative

## 2022-12-30 NOTE — Progress Notes (Signed)
Assessment: 1. Nephrolithiasis     Plan: I personally reviewed the KUB study from today which shows a 10 x 4 mm calcification in the left renal shadow. I again discussed treatment options for the left renal calculus including observation, shockwave lithotripsy, ureteroscopic laser lithotripsy, and PCNL. She would like to proceed with left shockwave lithotripsy in the near future. Risk of the procedure discussed with the patient in detail.  Procedure: The patient will be scheduled for left ESL at [facility].  Surgical request is placed with the surgery schedulers and will be scheduled at the patient's/family request. Informed consent is given as documented below. Anesthesia:  local  The patient does not have sleep apnea, history of MRSA, history of VRE, history of cardiac device requiring special anesthetic needs. Patient is stable and considered clear for surgical in an outpatient ambulatory surgery setting as well as patient hospital setting.  Consent for Operation or Procedure: Provider Certification I hereby certify that the nature, purpose, benefits, usual and most frequent risks of, and alternatives to, the operation or procedure have been explained to the patient (or person authorized to sign for the patient) either by me as responsible physician or by the provider who is to perform the operation or procedure. Time spent such that the patient/family has had an opportunity to ask questions, and that those questions have been answered. The patient or the patient's representative has been advised that selected tasks may be performed by assistants to the primary health care provider(s). I believe that the patient (or person authorized to sign for the patient) understands what has been explained, and has consented to the operation or procedure. No guarantees were implied or made.  Chief Complaint:  Chief Complaint  Patient presents with   Nephrolithiasis    History of  Present Illness:  Darlene Lopez is a 52 y.o. female who is seen for evaluation of nephrolithiasis.   At her initial visit in August 2024, she reported a 6-week history of intermittent left-sided back and flank pain.  No associated nausea, vomiting, fever, chills.  No dysuria or gross hematuria. She was evaluated with a CT abdomen and pelvis without contrast on 09/30/2022 for left-sided flank pain.  This study showed no evidence of hydronephrosis, bilateral intrarenal calculi with a 10 mm nonobstructing stone in the mid upper left kidney.  She has a prior history of kidney stones passing several stones since age 44.  She has previously seen Dr. Tera Partridge in [employer].  She has had a prior ureteral stent for management of her stones.  Portions of the above documentation were copied from a prior visit for review purposes only.   Past Medical History:  Past Medical History:  Diagnosis Date   Chicken pox    Kidney stones     Past Surgical History:  Past Surgical History:  Procedure Laterality Date   melanoma removal      Allergies:  No Known Allergies  Family History:  Family History  Problem Relation Age of Onset   Prostate cancer Father    Lung cancer Paternal Grandmother     Social History:  Social History   Tobacco Use   Smoking status: Never   Smokeless tobacco: Never  Substance Use Topics   Alcohol use: No    Alcohol/week: 0.0 standard drinks of alcohol   Drug use: No    ROS: Constitutional:  Negative for fever, chills, weight loss CV: Negative for chest pain, previous MI, hypertension Respiratory:  Negative for shortness  of breath, wheezing, sleep apnea, frequent cough GI:  Negative for nausea, vomiting, bloody stool, GERD  Physical exam: BP 124/76   Pulse 90   Ht 5\' 3"  (1.6 m)   Wt 255 lb (115.7 kg)   BMI 45.17 kg/m  GENERAL APPEARANCE:  Well appearing, well developed, well nourished, NAD HEENT:  Atraumatic, normocephalic, oropharynx clear NECK:   Supple without lymphadenopathy or thyromegaly ABDOMEN:  Soft, non-tender, no masses EXTREMITIES:  Moves all extremities well, without clubbing, cyanosis, or edema NEUROLOGIC:  Alert and oriented x 3, normal gait, CN II-XII grossly intact MENTAL STATUS:  appropriate BACK:  Non-tender to palpation, No CVAT SKIN:  Warm, dry, and intact  Results: U/A: negative

## 2022-12-31 LAB — URINALYSIS, ROUTINE W REFLEX MICROSCOPIC
Bilirubin, UA: NEGATIVE
Glucose, UA: NEGATIVE
Ketones, UA: NEGATIVE
Leukocytes,UA: NEGATIVE
Nitrite, UA: NEGATIVE
Protein,UA: NEGATIVE
RBC, UA: NEGATIVE
Specific Gravity, UA: 1.025 (ref 1.005–1.030)
Urobilinogen, Ur: 0.2 mg/dL (ref 0.2–1.0)
pH, UA: 7 (ref 5.0–7.5)

## 2023-01-19 ENCOUNTER — Encounter (HOSPITAL_BASED_OUTPATIENT_CLINIC_OR_DEPARTMENT_OTHER): Payer: Self-pay | Admitting: Urology

## 2023-01-19 NOTE — Progress Notes (Signed)
Spoke w/ via phone for pre-op interview--- Darlene Lopez needs dos----              UPT COVID test -----patient states asymptomatic no test needed Arrive at ------- 1100 NPO after MN NO Solid Food.  Clear liquids from MN until--- 0500 Med rec completed. Pt aware to hold ASA/NSAIDs and supplements per [facility] protocol.  Medications to take morning of surgery ----- tylenol prn Diabetic/Weight loss medication ----- n/a No Alcohol or recreational drugs for 24 hours/Tobacco products for 6 hours ---- n/a Patient instructed to bring blue lithotripsy folder, photo id and insurance card day of surgery. Patient aware to have Driver (ride ) / caregiver for 24 hours after surgery ----- Jonny Ruiz (spouse) Patient Special Instructions ----- Pre-Op special Instructions ----- take laxative of choice day before procedure Patient verbalized understanding of instructions that were given at this phone interview. Patient denies shortness of breath, chest pain, fever, cough at this phone interview.

## 2023-01-19 NOTE — Progress Notes (Signed)
Left voice mail for pt to return call today or tomorrow for instructions.

## 2023-01-25 ENCOUNTER — Ambulatory Visit (HOSPITAL_COMMUNITY): Payer: BC Managed Care – PPO

## 2023-01-25 ENCOUNTER — Ambulatory Visit (HOSPITAL_BASED_OUTPATIENT_CLINIC_OR_DEPARTMENT_OTHER)
Admission: RE | Admit: 2023-01-25 | Discharge: 2023-01-25 | Disposition: A | Discharge status: Home / Self Care | Payer: BC Managed Care – PPO | Attending: Urology | Admitting: Urology

## 2023-01-25 ENCOUNTER — Encounter (HOSPITAL_BASED_OUTPATIENT_CLINIC_OR_DEPARTMENT_OTHER): Payer: Self-pay | Admitting: Urology

## 2023-01-25 ENCOUNTER — Encounter (HOSPITAL_BASED_OUTPATIENT_CLINIC_OR_DEPARTMENT_OTHER)
Admission: RE | Disposition: A | Discharge status: Home / Self Care | Payer: Self-pay | Source: Home / Self Care | Attending: Urology

## 2023-01-25 DIAGNOSIS — Z01818 Encounter for other preprocedural examination: Secondary | ICD-10-CM

## 2023-01-25 DIAGNOSIS — N200 Calculus of kidney: Secondary | ICD-10-CM | POA: Insufficient documentation

## 2023-01-25 HISTORY — PX: EXTRACORPOREAL SHOCK WAVE LITHOTRIPSY: SHX1557

## 2023-01-25 HISTORY — DX: Headache, unspecified: R51.9

## 2023-01-25 LAB — POCT PREGNANCY, URINE: Preg Test, Ur: NEGATIVE

## 2023-01-25 SURGERY — EXTRACORPOREAL SHOCK WAVE LITHOTRIPSY (ESWL)
Anesthesia: LOCAL | Laterality: Left

## 2023-01-25 MED ORDER — CIPROFLOXACIN HCL 500 MG PO TABS
ORAL_TABLET | ORAL | Status: AC
  Filled 2023-01-25: qty 1

## 2023-01-25 MED ORDER — TAMSULOSIN HCL 0.4 MG PO CAPS: 0.4000 mg | ORAL_CAPSULE | Freq: Every day | ORAL | 0 refills | Status: AC

## 2023-01-25 MED ORDER — DIAZEPAM 5 MG PO TABS
ORAL_TABLET | ORAL | Status: AC
  Filled 2023-01-25: qty 2

## 2023-01-25 MED ORDER — DIAZEPAM 5 MG PO TABS
10.0000 mg | ORAL_TABLET | ORAL | Status: AC
  Administered 2023-01-25: 10 mg via ORAL

## 2023-01-25 MED ORDER — CIPROFLOXACIN HCL 500 MG PO TABS
500.0000 mg | ORAL_TABLET | ORAL | Status: AC
  Administered 2023-01-25: 500 mg via ORAL

## 2023-01-25 MED ORDER — HYDROCODONE-ACETAMINOPHEN 5-325 MG PO TABS: 1.0000 | ORAL_TABLET | Freq: Four times a day (QID) | ORAL | 0 refills | Status: DC | PRN

## 2023-01-25 MED ORDER — DIPHENHYDRAMINE HCL 25 MG PO CAPS
25.0000 mg | ORAL_CAPSULE | ORAL | Status: AC
  Administered 2023-01-25: 25 mg via ORAL

## 2023-01-25 MED ORDER — DIPHENHYDRAMINE HCL 25 MG PO CAPS
ORAL_CAPSULE | ORAL | Status: AC
  Filled 2023-01-25: qty 1

## 2023-01-25 MED ORDER — SODIUM CHLORIDE 0.9 % IV SOLN: INTRAVENOUS | Status: DC

## 2023-01-25 NOTE — Op Note (Signed)
Operative Note  Please see operative note from [facility] scanned in chart  Milderd Meager, MD [facility] [phone]

## 2023-01-25 NOTE — Interval H&P Note (Signed)
History and Physical Interval Note:  01/25/2023 12:24 PM  DEICI PATIENT  has presented today for surgery, with the diagnosis of left nephrolithiasis.  The various methods of treatment have been discussed with the patient and family. After consideration of risks, benefits and other options for treatment, the patient has consented to  Procedure(s): EXTRACORPOREAL SHOCK WAVE LITHOTRIPSY (ESWL) (Left) as a surgical intervention.  The patient's history has been reviewed, patient examined, no change in status, stable for surgery.  I have reviewed the patient's chart and labs.  Questions were answered to the patient's satisfaction.     Di Kindle

## 2023-01-26 ENCOUNTER — Other Ambulatory Visit: Payer: Self-pay | Admitting: Urology

## 2023-01-26 ENCOUNTER — Telehealth: Payer: Self-pay | Admitting: Urology

## 2023-01-26 ENCOUNTER — Encounter (HOSPITAL_BASED_OUTPATIENT_CLINIC_OR_DEPARTMENT_OTHER): Payer: Self-pay | Admitting: Urology

## 2023-01-26 ENCOUNTER — Encounter: Payer: Self-pay | Admitting: Urology

## 2023-01-26 MED ORDER — ONDANSETRON 4 MG PO TBDP: 4.0000 mg | ORAL_TABLET | Freq: Three times a day (TID) | ORAL | 0 refills | Status: AC | PRN

## 2023-01-26 MED ORDER — OXYCODONE HCL 5 MG PO TABS: 5.0000 mg | ORAL_TABLET | Freq: Four times a day (QID) | ORAL | 0 refills | Status: DC | PRN

## 2023-01-26 NOTE — Telephone Encounter (Signed)
Vomited an hour after taking hydrocodone. Feeling nauseous. Patient is still having some pain and wants to know what she can take for it.

## 2023-01-26 NOTE — Telephone Encounter (Signed)
Spoke w pt, advised her 2 new Rx's were sent in for her, a new pain med and anti nausea. Pt states she did have an old zofran at home that she took over night to keep the nausea away. Advised pt that was ok and to pick up her new Rx's. Pt states she is feeling better today and will pick up her new Rx's.

## 2023-01-30 DIAGNOSIS — J039 Acute tonsillitis, unspecified: Secondary | ICD-10-CM | POA: Diagnosis not present

## 2023-02-01 ENCOUNTER — Ambulatory Visit (INDEPENDENT_AMBULATORY_CARE_PROVIDER_SITE_OTHER): Payer: BC Managed Care – PPO | Admitting: Urology

## 2023-02-01 ENCOUNTER — Other Ambulatory Visit: Payer: Self-pay

## 2023-02-01 ENCOUNTER — Encounter: Payer: Self-pay | Admitting: Urology

## 2023-02-01 ENCOUNTER — Ambulatory Visit (HOSPITAL_BASED_OUTPATIENT_CLINIC_OR_DEPARTMENT_OTHER)
Admission: RE | Admit: 2023-02-01 | Discharge: 2023-02-01 | Disposition: A | Discharge status: Home / Self Care | Payer: BC Managed Care – PPO | Source: Ambulatory Visit | Attending: Urology | Admitting: Urology

## 2023-02-01 VITALS — BP 130/83 | HR 86 | Temp 98.1°F

## 2023-02-01 DIAGNOSIS — N200 Calculus of kidney: Secondary | ICD-10-CM

## 2023-02-01 DIAGNOSIS — Z09 Encounter for follow-up examination after completed treatment for conditions other than malignant neoplasm: Secondary | ICD-10-CM

## 2023-02-01 DIAGNOSIS — R109 Unspecified abdominal pain: Secondary | ICD-10-CM | POA: Diagnosis not present

## 2023-02-01 DIAGNOSIS — R319 Hematuria, unspecified: Secondary | ICD-10-CM | POA: Diagnosis not present

## 2023-02-01 DIAGNOSIS — Z87442 Personal history of urinary calculi: Secondary | ICD-10-CM

## 2023-02-01 LAB — URINALYSIS, ROUTINE W REFLEX MICROSCOPIC
Bilirubin, UA: NEGATIVE
Glucose, UA: NEGATIVE
Ketones, UA: NEGATIVE
Nitrite, UA: NEGATIVE
Specific Gravity, UA: 1.02 (ref 1.005–1.030)
Urobilinogen, Ur: 0.2 mg/dL (ref 0.2–1.0)
pH, UA: 7 (ref 5.0–7.5)

## 2023-02-01 LAB — MICROSCOPIC EXAMINATION: RBC, Urine: >30 /[HPF] — AB (ref 0–2)

## 2023-02-01 NOTE — Progress Notes (Signed)
Assessment: 1. Nephrolithiasis     Plan: I personally reviewed the KUB study from today.  No obvious calcifications are seen in the area of the left renal shadow or along the expected course of the left ureter. Continue tamsulosin. Continue pain medication and antiemetic as needed. Return to office in 1 week with KUB.  Chief Complaint:  Chief Complaint  Patient presents with   Nephrolithiasis    History of Present Illness:  Darlene Lopez is a 52 y.o. female who is seen for continued evaluation of nephrolithiasis.   At her initial visit in August 2024, she reported a 6-week history of intermittent left-sided back and flank pain.  No associated nausea, vomiting, fever, chills.  No dysuria or gross hematuria. She was evaluated with a CT abdomen and pelvis without contrast on 09/30/2022 for left-sided flank pain.  This study showed no evidence of hydronephrosis, bilateral intrarenal calculi with a 10 mm nonobstructing stone in the mid upper left kidney.  She has a prior history of kidney stones passing several stones since age 89.  She has previously seen Dr. Tera Partridge in [place].  She has had a prior ureteral stent for management of her stones.  She is status post left shockwave lithotripsy on 01/25/2023. She has had some intermittent left flank pain since the procedure.  She has passed stone fragments.  She has also had associated nausea and vomiting.  This is slightly improved today.  She is not having any pain at the present time.  Her last episode was earlier this morning.  She also has noted some increased frequency and urgency.  She initially had some gross hematuria which cleared.  She noted some pink-colored urine this morning.  She has had a low-grade fever to 100.6 but was recently diagnosed with possible tonsillitis and started on antibiotics.  Portions of the above documentation were copied from a prior visit for review purposes only.   Past Medical History:  Past Medical  History:  Diagnosis Date   Chicken pox    Headache    Migraines   Kidney stones     Past Surgical History:  Past Surgical History:  Procedure Laterality Date   EXTRACORPOREAL SHOCK WAVE LITHOTRIPSY Left 01/25/2023   Procedure: EXTRACORPOREAL SHOCK WAVE LITHOTRIPSY (ESWL);  Surgeon: Milderd Meager., MD;  Location: [facility];  Service: Urology;  Laterality: Left;   KIDNEY STONE SURGERY     w stent, age 51   melanoma removal      Allergies:  No Known Allergies  Family History:  Family History  Problem Relation Age of Onset   Prostate cancer Father    Lung cancer Paternal Grandmother     Social History:  Social History   Tobacco Use   Smoking status: Never   Smokeless tobacco: Never  Substance Use Topics   Alcohol use: No    Alcohol/week: 0.0 standard drinks of alcohol   Drug use: No    ROS: Constitutional:  Negative for fever, chills, weight loss CV: Negative for chest pain, previous MI, hypertension Respiratory:  Negative for shortness of breath, wheezing, sleep apnea, frequent cough GI:  Negative for nausea, vomiting, bloody stool, GERD   Physical exam: BP 130/83   Pulse 86   Temp 98.1 F (36.7 C) (Oral)   LMP 01/13/2023 (Approximate)  GENERAL APPEARANCE:  Well appearing, well developed, well nourished, NAD HEENT:  Atraumatic, normocephalic, oropharynx clear NECK:  Supple without lymphadenopathy or thyromegaly ABDOMEN:  Soft, non-tender, no masses EXTREMITIES:  Moves  all extremities well, without clubbing, cyanosis, or edema NEUROLOGIC:  Alert and oriented x 3, normal gait, CN II-XII grossly intact MENTAL STATUS:  appropriate BACK:  Non-tender to palpation, No CVAT SKIN:  Warm, dry, and intact  Results: U/A: 0-5 WBC, >30 RBC, few bacteria

## 2023-02-02 DIAGNOSIS — N200 Calculus of kidney: Secondary | ICD-10-CM | POA: Diagnosis not present

## 2023-02-04 ENCOUNTER — Encounter: Payer: Self-pay | Admitting: Urology

## 2023-02-10 ENCOUNTER — Ambulatory Visit: Payer: BC Managed Care – PPO | Admitting: Urology

## 2023-02-10 ENCOUNTER — Ambulatory Visit (HOSPITAL_BASED_OUTPATIENT_CLINIC_OR_DEPARTMENT_OTHER)
Admission: RE | Admit: 2023-02-10 | Discharge: 2023-02-10 | Disposition: A | Discharge status: Home / Self Care | Payer: BC Managed Care – PPO | Source: Ambulatory Visit | Attending: Urology | Admitting: Urology

## 2023-02-10 ENCOUNTER — Encounter: Payer: Self-pay | Admitting: Urology

## 2023-02-10 VITALS — BP 120/68 | HR 87

## 2023-02-10 DIAGNOSIS — Z09 Encounter for follow-up examination after completed treatment for conditions other than malignant neoplasm: Secondary | ICD-10-CM

## 2023-02-10 DIAGNOSIS — Z87442 Personal history of urinary calculi: Secondary | ICD-10-CM

## 2023-02-10 DIAGNOSIS — N200 Calculus of kidney: Secondary | ICD-10-CM | POA: Diagnosis not present

## 2023-02-10 LAB — URINALYSIS, ROUTINE W REFLEX MICROSCOPIC
Bilirubin, UA: NEGATIVE
Glucose, UA: NEGATIVE
Ketones, UA: NEGATIVE
Leukocytes,UA: NEGATIVE
Nitrite, UA: NEGATIVE
Protein,UA: NEGATIVE
RBC, UA: NEGATIVE
Specific Gravity, UA: 1.01 (ref 1.005–1.030)
Urobilinogen, Ur: 0.2 mg/dL (ref 0.2–1.0)
pH, UA: 6 (ref 5.0–7.5)

## 2023-02-10 NOTE — Progress Notes (Unsigned)
   Assessment: 1. Nephrolithiasis     Plan: Today I had a long discussion with the patient regarding her nephrolithiasis.  She is currently doing well and her UA today shows no evidence of microscopic hematuria.  I discussed with her in detail stone prevention measures including proper fluid intake, sodium restriction, moderation of animal protein and oxalate.   Detailed educational material regarding stone prevention was provided to the patient. I also discussed with her the option of further metabolic evaluation.  Patient would like to pursue 24-hour urine studies.  Will arrange for her to do 24-hour collections in February with follow-up with Dr. Pete Glatter in March.  Chief Complaint: Kidney stone  HPI: Darlene Lopez is a 52 y.o. female who presents for continued evaluation of nephrolithiasis. She is status post left shockwave lithotripsy on 01/25/2023. She was seen by Dr. Pete Glatter on 02/01/23 for initial FU. At that time she reported intermittent left flank pain since the procedure.  She has passed stone fragments.   Stone analysis revealed CaOx. This past week she has passed more substantial fragments.  Currently feels well.  No further flank pain, hematuria or other complaints.  KUB today-  no stone fragments visualized UA entirely clear  Portions of the above documentation were copied from a prior visit for review purposes only.  Allergies: No Known Allergies  PMH: Past Medical History:  Diagnosis Date   Chicken pox    Headache    Migraines   Kidney stones     PSH: Past Surgical History:  Procedure Laterality Date   EXTRACORPOREAL SHOCK WAVE LITHOTRIPSY Left 01/25/2023   Procedure: EXTRACORPOREAL SHOCK WAVE LITHOTRIPSY (ESWL);  Surgeon: Milderd Meager., MD;  Location: [facility];  Service: Urology;  Laterality: Left;   KIDNEY STONE SURGERY     w stent, age 56   melanoma removal      SH: Social History   Tobacco Use   Smoking status:  Never   Smokeless tobacco: Never  Substance Use Topics   Alcohol use: No    Alcohol/week: 0.0 standard drinks of alcohol   Drug use: No    ROS: Constitutional:  Negative for fever, chills, weight loss CV: Negative for chest pain, previous MI, hypertension Respiratory:  Negative for shortness of breath, wheezing, sleep apnea, frequent cough GI:  Negative for nausea, vomiting, bloody stool, GERD  PE: BP 120/68   Pulse 87   LMP 01/13/2023 (Approximate)  GENERAL APPEARANCE:  Well appearing, well developed, well nourished, NAD    Results: UA entirely clear

## 2023-05-18 ENCOUNTER — Ambulatory Visit: Admitting: Urology

## 2023-05-18 ENCOUNTER — Ambulatory Visit: Payer: BC Managed Care – PPO | Admitting: Urology

## 2023-12-28 ENCOUNTER — Telehealth: Admitting: Family Medicine

## 2023-12-28 DIAGNOSIS — J069 Acute upper respiratory infection, unspecified: Secondary | ICD-10-CM

## 2023-12-28 NOTE — Progress Notes (Signed)
 Virtual Visit Consent   Darlene Lopez, you are scheduled for a virtual visit with a  provider today. Just as with appointments in the office, your consent must be obtained to participate. Your consent will be active for this visit and any virtual visit you may have with one of our providers in the next 365 days. If you have a MyChart account, a copy of this consent can be sent to you electronically.  As this is a virtual visit, video technology does not allow for your provider to perform a traditional examination. This may limit your provider's ability to fully assess your condition. If your provider identifies any concerns that need to be evaluated in person or the need to arrange testing (such as labs, EKG, etc.), we will make arrangements to do so. Although advances in technology are sophisticated, we cannot ensure that it will always work on either your end or our end. If the connection with a video visit is poor, the visit may have to be switched to a telephone visit. With either a video or telephone visit, we are not always able to ensure that we have a secure connection.  By engaging in this virtual visit, you consent to the provision of healthcare and authorize for your insurance to be billed (if applicable) for the services provided during this visit. Depending on your insurance coverage, you may receive a charge related to this service.  I need to obtain your verbal consent now. Are you willing to proceed with your visit today? Darlene Lopez has provided verbal consent on 12/28/2023 for a virtual visit (video or telephone). Darlene CHRISTELLA Barefoot, NP  Date: 12/28/2023 12:31 PM   Virtual Visit via Video Note   I, Darlene Lopez, connected with  Darlene Lopez  ([id number], 12/31/1970) on 12/28/23 at 12:30 PM EST by a video-enabled telemedicine application and verified that I am speaking with the correct person using two identifiers.  Location: Patient: Virtual Visit Location  Patient: Home Provider: Virtual Visit Location Provider: Home Office   I discussed the limitations of evaluation and management by telemedicine and the availability of in person appointments. The patient expressed understanding and agreed to proceed.    History of Present Illness: Darlene Lopez is a 53 y.o. who identifies as a female who was assigned female at birth, and is being seen today for headache and congestion  Onset was Sunday- 2 days ago-discomfort in throat- scratchy raw feeling. Feeling sinus pressure, ache in ears. Nasal congestion.  Associated symptoms are temp 99.1, 100.5, headache,  Modifying factors are tylenol , mucinex liquid, nascort Denies chest pain, shortness of breath  Exposure to sick contacts- unknown COVID test: no Vaccines: none  Problems:  Patient Active Problem List   Diagnosis Date Noted   B12 deficiency 10/23/2022   Left flank pain 09/15/2022   Nephrolithiasis 09/15/2022   Thoracic spine pain 09/15/2022   Left carpal tunnel syndrome 09/15/2022   COVID-19 virus infection 09/13/2019   RUQ pain 09/13/2019   Coccyx pain 09/13/2019   Vertigo 09/13/2019   Hyperglycemia 09/13/2019   Nonintractable episodic headache 03/29/2017   Left ear pain 03/29/2017   Migraine without aura and without status migrainosus, not intractable 02/02/2017   Encounter for well adult exam with abnormal findings 10/08/2016   Rash and nonspecific skin eruption 04/04/2014   OBESITY 02/05/2010   TONSILLITIS, CHRONIC 04/19/2009   SINUSITIS- ACUTE-NOS 11/02/2008   Melanoma of skin (HCC) 08/03/2008   HLD (hyperlipidemia) 08/03/2008   Allergic  rhinitis 08/03/2008   GERD 08/03/2008    Allergies: No Known Allergies Medications:  Current Outpatient Medications:    Acetaminophen  (TYLENOL  PO), Take 500 mg by mouth as needed., Disp: , Rfl:    ascorbic acid (VITAMIN C) 500 MG tablet, Take 500 mg by mouth daily., Disp: , Rfl:    Cholecalciferol (VITAMIN D -3 PO), Take 10,000 Int'l  Units/day by mouth daily., Disp: , Rfl:    cyanocobalamin  (VITAMIN B12) 1000 MCG tablet, Take 1,000 mcg by mouth daily., Disp: , Rfl:    ibuprofen (ADVIL) 200 MG tablet, Take 200 mg by mouth every 6 (six) hours as needed for moderate pain (pain score 4-6)., Disp: , Rfl:    Menaquinone-7 (VITAMIN K2 PO), Take 1 tablet by mouth daily., Disp: , Rfl:    Multiple Vitamin (MULTIVITAMIN) capsule, Take 1 capsule by mouth daily., Disp: , Rfl:    ondansetron  (ZOFRAN -ODT) 4 MG disintegrating tablet, Take 1 tablet (4 mg total) by mouth every 8 (eight) hours as needed for nausea or vomiting., Disp: 20 tablet, Rfl: 0   oxyCODONE  (OXY IR/ROXICODONE ) 5 MG immediate release tablet, Take 1 tablet (5 mg total) by mouth every 6 (six) hours as needed for severe pain (pain score 7-10)., Disp: 15 tablet, Rfl: 0   SUMAtriptan  (IMITREX ) 100 MG tablet, Take 1 tablet (100 mg total) by mouth every 2 (two) hours as needed for migraine or headache. May repeat in 2 hours if headache persists or recurs., Disp: 10 tablet, Rfl: 5   tamsulosin  (FLOMAX ) 0.4 MG CAPS capsule, Take 1 capsule (0.4 mg total) by mouth daily., Disp: 14 capsule, Rfl: 0   TURMERIC PO, Take 1 capsule by mouth daily., Disp: , Rfl:    Zinc 50 MG TABS, Take 1 tablet by mouth daily., Disp: , Rfl:   Observations/Objective: Patient is well-developed, well-nourished in no acute distress.  Resting comfortably  at home.  Head is normocephalic, atraumatic.  No labored breathing.  Speech is clear and coherent with logical content.  Patient is alert and oriented at baseline.    Assessment and Plan:  1. Viral URI (Primary)   - Increased rest - Increasing Fluids - Acetaminophen  / ibuprofen as needed for fever/pain.  - Salt water gargling, chloraseptic spray and throat lozenges  - Saline nasal spray if congestion or if nasal passages feel dry. - Humidifying the air.    Reviewed side effects, risks and benefits of medication.    Patient acknowledged  agreement and understanding of the plan.   Past Medical, Surgical, Social History, Allergies, and Medications have been Reviewed.      Follow Up Instructions: I discussed the assessment and treatment plan with the patient. The patient was provided an opportunity to ask questions and all were answered. The patient agreed with the plan and demonstrated an understanding of the instructions.  A copy of instructions were sent to the patient via MyChart unless otherwise noted below.    The patient was advised to call back or seek an in-person evaluation if the symptoms worsen or if the condition fails to improve as anticipated.    Darlene CHRISTELLA Barefoot, NP

## 2023-12-28 NOTE — Patient Instructions (Addendum)
 Darlene Lopez Maiden, thank you for joining Darlene CHRISTELLA Barefoot, NP for today's virtual visit.  While this provider is not your primary care provider (PCP), if your PCP is located in our provider database this encounter information will be shared with them immediately following your visit.   A [facility] MyChart account gives you access to today's visit and all your visits, tests, and labs performed at [facility]  click here if you don't have a [facility] MyChart account or go to [email or web address]  Consent: (Patient) Darlene Lopez provided verbal consent for this virtual visit at the beginning of the encounter.  Current Medications:  Current Outpatient Medications:    Acetaminophen  (TYLENOL  PO), Take 500 mg by mouth as needed., Disp: , Rfl:    ascorbic acid (VITAMIN C) 500 MG tablet, Take 500 mg by mouth daily., Disp: , Rfl:    Cholecalciferol (VITAMIN D -3 PO), Take 10,000 Int'l Units/day by mouth daily., Disp: , Rfl:    cyanocobalamin  (VITAMIN B12) 1000 MCG tablet, Take 1,000 mcg by mouth daily., Disp: , Rfl:    ibuprofen (ADVIL) 200 MG tablet, Take 200 mg by mouth every 6 (six) hours as needed for moderate pain (pain score 4-6)., Disp: , Rfl:    Menaquinone-7 (VITAMIN K2 PO), Take 1 tablet by mouth daily., Disp: , Rfl:    Multiple Vitamin (MULTIVITAMIN) capsule, Take 1 capsule by mouth daily., Disp: , Rfl:    ondansetron  (ZOFRAN -ODT) 4 MG disintegrating tablet, Take 1 tablet (4 mg total) by mouth every 8 (eight) hours as needed for nausea or vomiting., Disp: 20 tablet, Rfl: 0   oxyCODONE  (OXY IR/ROXICODONE ) 5 MG immediate release tablet, Take 1 tablet (5 mg total) by mouth every 6 (six) hours as needed for severe pain (pain score 7-10)., Disp: 15 tablet, Rfl: 0   SUMAtriptan  (IMITREX ) 100 MG tablet, Take 1 tablet (100 mg total) by mouth every 2 (two) hours as needed for migraine or headache. May repeat in 2 hours if headache persists or recurs., Disp: 10 tablet, Rfl: 5    tamsulosin  (FLOMAX ) 0.4 MG CAPS capsule, Take 1 capsule (0.4 mg total) by mouth daily., Disp: 14 capsule, Rfl: 0   TURMERIC PO, Take 1 capsule by mouth daily., Disp: , Rfl:    Zinc 50 MG TABS, Take 1 tablet by mouth daily., Disp: , Rfl:    Medications ordered in this encounter:  No orders of the defined types were placed in this encounter.    *If you need refills on other medications prior to your next appointment, please contact your pharmacy*  Follow-Up: Call back or seek an in-person evaluation if the symptoms worsen or if the condition fails to improve as anticipated.  [facility] [phone]  Other Instructions  URI recommendations: - Increased rest - Increasing Fluids - Acetaminophen  / ibuprofen as needed for fever/pain.  - Salt water gargling, chloraseptic spray and throat lozenges - Saline nasal spray if congestion or if nasal passages feel dry. - Humidifying the air.    If you have been instructed to have an in-person evaluation today at a local Urgent Care facility, please use the link below. It will take you to a list of all of our available [facility], including address, phone number and hours of operation. Please do not delay care.  [facility]  If you or a family member do not have a primary care provider, use the link below to schedule a visit and establish care.  When you choose a [facility] primary care physician or advanced practice provider, you gain a long-term partner in health. Find a Primary Care Provider  Learn more about [facility]'s in-office and virtual care options: [facility] - Get Care Now

## 2024-01-28 ENCOUNTER — Encounter: Payer: Self-pay | Admitting: Internal Medicine

## 2024-01-28 ENCOUNTER — Ambulatory Visit: Payer: Self-pay | Admitting: Internal Medicine

## 2024-01-28 ENCOUNTER — Ambulatory Visit: Admitting: Internal Medicine

## 2024-01-28 VITALS — BP 140/80 | HR 81 | Temp 98.7°F | Ht 63.0 in | Wt 268.0 lb

## 2024-01-28 DIAGNOSIS — R739 Hyperglycemia, unspecified: Secondary | ICD-10-CM

## 2024-01-28 DIAGNOSIS — R5383 Other fatigue: Secondary | ICD-10-CM

## 2024-01-28 DIAGNOSIS — L918 Other hypertrophic disorders of the skin: Secondary | ICD-10-CM | POA: Diagnosis not present

## 2024-01-28 DIAGNOSIS — G43009 Migraine without aura, not intractable, without status migrainosus: Secondary | ICD-10-CM

## 2024-01-28 DIAGNOSIS — E559 Vitamin D deficiency, unspecified: Secondary | ICD-10-CM | POA: Diagnosis not present

## 2024-01-28 DIAGNOSIS — C439 Malignant melanoma of skin, unspecified: Secondary | ICD-10-CM

## 2024-01-28 DIAGNOSIS — E7800 Pure hypercholesterolemia, unspecified: Secondary | ICD-10-CM | POA: Diagnosis not present

## 2024-01-28 DIAGNOSIS — Z0000 Encounter for general adult medical examination without abnormal findings: Secondary | ICD-10-CM

## 2024-01-28 DIAGNOSIS — Z85820 Personal history of malignant melanoma of skin: Secondary | ICD-10-CM | POA: Insufficient documentation

## 2024-01-28 DIAGNOSIS — L723 Sebaceous cyst: Secondary | ICD-10-CM | POA: Insufficient documentation

## 2024-01-28 DIAGNOSIS — N951 Menopausal and female climacteric states: Secondary | ICD-10-CM

## 2024-01-28 DIAGNOSIS — Z1231 Encounter for screening mammogram for malignant neoplasm of breast: Secondary | ICD-10-CM

## 2024-01-28 DIAGNOSIS — G4710 Hypersomnia, unspecified: Secondary | ICD-10-CM

## 2024-01-28 DIAGNOSIS — E538 Deficiency of other specified B group vitamins: Secondary | ICD-10-CM | POA: Diagnosis not present

## 2024-01-28 DIAGNOSIS — M1712 Unilateral primary osteoarthritis, left knee: Secondary | ICD-10-CM

## 2024-01-28 DIAGNOSIS — L304 Erythema intertrigo: Secondary | ICD-10-CM | POA: Insufficient documentation

## 2024-01-28 DIAGNOSIS — D239 Other benign neoplasm of skin, unspecified: Secondary | ICD-10-CM | POA: Insufficient documentation

## 2024-01-28 DIAGNOSIS — Z0001 Encounter for general adult medical examination with abnormal findings: Secondary | ICD-10-CM

## 2024-01-28 LAB — CBC WITH DIFFERENTIAL/PLATELET
Basophils Absolute: 0 K/uL (ref 0.0–0.1)
Basophils Relative: 0.7 % (ref 0.0–3.0)
Eosinophils Absolute: 0.2 K/uL (ref 0.0–0.7)
Eosinophils Relative: 2.3 % (ref 0.0–5.0)
HCT: 43.6 % (ref 36.0–46.0)
Hemoglobin: 14.6 g/dL (ref 12.0–15.0)
Lymphocytes Relative: 23 % (ref 12.0–46.0)
Lymphs Abs: 1.5 K/uL (ref 0.7–4.0)
MCHC: 33.5 g/dL (ref 30.0–36.0)
MCV: 86.7 fl (ref 78.0–100.0)
Monocytes Absolute: 0.6 K/uL (ref 0.1–1.0)
Monocytes Relative: 8.6 % (ref 3.0–12.0)
Neutro Abs: 4.4 K/uL (ref 1.4–7.7)
Neutrophils Relative %: 65.4 % (ref 43.0–77.0)
Platelets: 224 K/uL (ref 150.0–400.0)
RBC: 5.04 Mil/uL (ref 3.87–5.11)
RDW: 14.3 % (ref 11.5–15.5)
WBC: 6.7 K/uL (ref 4.0–10.5)

## 2024-01-28 LAB — BASIC METABOLIC PANEL WITH GFR
BUN: 15 mg/dL (ref 6–23)
CO2: 31 meq/L (ref 19–32)
Calcium: 9.5 mg/dL (ref 8.4–10.5)
Chloride: 100 meq/L (ref 96–112)
Creatinine, Ser: 0.53 mg/dL (ref 0.40–1.20)
GFR: 105.85 mL/min (ref >60.00)
Glucose, Bld: 99 mg/dL (ref 70–99)
Potassium: 4.2 meq/L (ref 3.5–5.1)
Sodium: 139 meq/L (ref 135–145)

## 2024-01-28 LAB — HEPATIC FUNCTION PANEL
ALT: 27 U/L (ref 0–35)
AST: 19 U/L (ref 0–37)
Albumin: 4.4 g/dL (ref 3.5–5.2)
Alkaline Phosphatase: 71 U/L (ref 39–117)
Bilirubin, Direct: 0.1 mg/dL (ref 0.0–0.3)
Total Bilirubin: 0.8 mg/dL (ref 0.2–1.2)
Total Protein: 7.3 g/dL (ref 6.0–8.3)

## 2024-01-28 LAB — LIPID PANEL
Cholesterol: 193 mg/dL (ref 0–200)
HDL: 48 mg/dL (ref >39.00)
LDL Cholesterol: 121 mg/dL — ABNORMAL HIGH (ref 0–99)
NonHDL: 145.16
Total CHOL/HDL Ratio: 4
Triglycerides: 119 mg/dL (ref 0.0–149.0)
VLDL: 23.8 mg/dL (ref 0.0–40.0)

## 2024-01-28 LAB — VITAMIN D 25 HYDROXY (VIT D DEFICIENCY, FRACTURES): VITD: 57.83 ng/mL (ref 30.00–100.00)

## 2024-01-28 LAB — URINALYSIS, ROUTINE W REFLEX MICROSCOPIC
Bilirubin Urine: NEGATIVE
Hgb urine dipstick: NEGATIVE
Ketones, ur: NEGATIVE
Leukocytes,Ua: NEGATIVE
Nitrite: NEGATIVE
RBC / HPF: NONE SEEN (ref 0–?)
Specific Gravity, Urine: <=1.005 — AB (ref 1.000–1.030)
Total Protein, Urine: NEGATIVE
Urine Glucose: NEGATIVE
Urobilinogen, UA: 0.2 (ref 0.0–1.0)
pH: 7 (ref 5.0–8.0)

## 2024-01-28 LAB — HEMOGLOBIN A1C: Hgb A1c MFr Bld: 5.8 % (ref 4.6–6.5)

## 2024-01-28 LAB — VITAMIN B12: Vitamin B-12: 423 pg/mL (ref 211–911)

## 2024-01-28 LAB — FOLLICLE STIMULATING HORMONE: FSH: 16.5 m[IU]/mL

## 2024-01-28 MED ORDER — KETOROLAC TROMETHAMINE 30 MG/ML IJ SOLN
30.0000 mg | Freq: Once | INTRAMUSCULAR | Status: AC
  Administered 2024-01-28: 30 mg via INTRAMUSCULAR

## 2024-01-28 MED ORDER — BUTALBITAL-APAP-CAFFEINE 50-325-40 MG PO TABS: 1.0000 | ORAL_TABLET | Freq: Four times a day (QID) | ORAL | 1 refills | Status: AC | PRN

## 2024-01-28 NOTE — Patient Instructions (Addendum)
 You had the Toradol  30 mg shot today  Please take all new medication as prescribed - the fioricet as needed for migraine  Ok to use the OTC Voltaren gel for the left knee arthritis  Please continue all other medications as before, and refills have been done if requested.  Please have the pharmacy call with any other refills you may need.  Please continue your efforts at being more active, low cholesterol diet, and weight control.  You are otherwise up to date with prevention measures today.  Please keep your appointments with your specialists as you may have planned - Urology  You will be contacted regarding the referral for: Pulmonary, and Dermatology, and mammogram  Please go to the LAB at the blood drawing area for the tests to be done  You will be contacted by phone if any changes need to be made immediately.  Otherwise, you will receive a letter about your results with an explanation, but please check with MyChart first.  Please make an Appointment to return for your 1 year visit, or sooner if needed

## 2024-01-28 NOTE — Progress Notes (Signed)
 Patient ID: Darlene Lopez, female   DOB: [birth year], 53 y.o.   MRN: [id number]         Chief Complaint:: wellness exam and acute migraine, hypersomnia, fatigue and possible menopausal symptoms, skin tags and hx of malanoma, left knee arthritis       HPI:  Darlene Lopez is a 53 y.o. female here for wellness exam; declines all vaccinations, due for mammogram, o/w up to date                         Also c/o bifrontal throbbing HA today, under significant stress due to social stressors.  Does have mild daytime hypersomnolance.  Also with fatigue and ? Menopausal symptoms.  Also multiple worsening skin tags about the neck, and hx of melanoma and former Darlene Lopez has retired.  Also with mild intermittent left knee pain without swelling or giveaways.  Pt denies chest pain, increased sob or doe, wheezing, orthopnea, PND, increased LE swelling, palpitations, dizziness or syncope.   Pt denies polydipsia, polyuria, or new focal neuro s/s.      Wt Readings from Last 3 Encounters:  01/28/24 268 lb (121.6 kg)  01/25/23 260 lb 6.4 oz (118.1 kg)  12/30/22 255 lb (115.7 kg)   BP Readings from Last 3 Encounters:  01/28/24 (!) 140/80  02/10/23 120/68  02/01/23 130/83   Immunization History  Administered Date(s) Administered   Influenza Split 03/02/2012   Td 02/23/2006   Tdap 10/08/2016   Health Maintenance Due  Topic Date Due   Zoster Vaccines- Shingrix (1 of 2) Never done   Cervical Cancer Screening (HPV/Pap Cotest)  08/23/2010   Mammogram  Never done   Pneumococcal Vaccine: 50+ Years (1 of 1 - PCV) Never done      Past Medical History:  Diagnosis Date   Chicken pox    Headache    Migraines   Kidney stones    Past Surgical History:  Procedure Laterality Date   EXTRACORPOREAL SHOCK WAVE LITHOTRIPSY Left 01/25/2023   Procedure: EXTRACORPOREAL SHOCK WAVE LITHOTRIPSY (ESWL);  Surgeon: Roseann Adine PARAS., MD;  Location: [facility];  Service: Urology;  Laterality: Left;    KIDNEY STONE SURGERY     w stent, age 65   melanoma removal      reports that she has never smoked. She has never used smokeless tobacco. She reports that she does not drink alcohol and does not use drugs. family history includes Lung cancer in her paternal grandmother; Prostate cancer in her father. No Known Allergies Current Outpatient Medications on File Prior to Visit  Medication Sig Dispense Refill   SUMAtriptan  (IMITREX ) 100 MG tablet Take 1 tablet (100 mg total) by mouth every 2 (two) hours as needed for migraine or headache. May repeat in 2 hours if headache persists or recurs. 10 tablet 5   Acetaminophen  (TYLENOL  PO) Take 500 mg by mouth as needed.     ascorbic acid (VITAMIN C) 500 MG tablet Take 500 mg by mouth daily.     Cholecalciferol (VITAMIN D -3 PO) Take 10,000 Int'l Units/day by mouth daily.     cyanocobalamin  (VITAMIN B12) 1000 MCG tablet Take 1,000 mcg by mouth daily.     ibuprofen (ADVIL) 200 MG tablet Take 200 mg by mouth every 6 (six) hours as needed for moderate pain (pain score 4-6).     Menaquinone-7 (VITAMIN K2 PO) Take 1 tablet by mouth daily.     Multiple Vitamin (MULTIVITAMIN) capsule Take  1 capsule by mouth daily.     ondansetron  (ZOFRAN -ODT) 4 MG disintegrating tablet Take 1 tablet (4 mg total) by mouth every 8 (eight) hours as needed for nausea or vomiting. 20 tablet 0   tamsulosin  (FLOMAX ) 0.4 MG CAPS capsule Take 1 capsule (0.4 mg total) by mouth daily. 14 capsule 0   TURMERIC PO Take 1 capsule by mouth daily.     Zinc 50 MG TABS Take 1 tablet by mouth daily.     No current facility-administered medications on file prior to visit.        ROS:  All others reviewed and negative.  Objective        PE:  BP (!) 140/80 (BP Location: Right Arm, Patient Position: Sitting, Cuff Size: Normal)   Pulse 81   Temp 98.7 F (37.1 C) (Oral)   Ht 5' 3 (1.6 m)   Wt 268 lb (121.6 kg)   LMP 07/08/2023 (Approximate)   SpO2 98%   BMI 47.47 kg/m                  Constitutional: Pt appears in NAD               HENT: Head: NCAT.                Right Ear: External ear normal.                 Left Ear: External ear normal.                Eyes: . Pupils are equal, round, and reactive to light. Conjunctivae and EOM are normal               Nose: without d/c or deformity               Neck: Neck supple. Gross normal ROM               Cardiovascular: Normal rate and regular rhythm.                 Pulmonary/Chest: Effort normal and breath sounds without rales or wheezing.                Abd:  Soft, NT, ND, + BS, no organomegaly               Neurological: Pt is alert. At baseline orientation, motor grossly intact, mild left knee crepitus without effusion               Skin: Skin is warm. No rashes, LE edema - none, has multiple moderate sized skin tags about the necklace line of neck,                Psychiatric: Pt behavior is normal without agitation   Micro: none  Cardiac tracings I have personally interpreted today:  none  Pertinent Radiological findings (summarize): none   Lab Results  Component Value Date   WBC 6.7 01/28/2024   HGB 14.6 01/28/2024   HCT 43.6 01/28/2024   PLT 224.0 01/28/2024   GLUCOSE 99 01/28/2024   CHOL 193 01/28/2024   TRIG 119.0 01/28/2024   HDL 48.00 01/28/2024   LDLDIRECT 150.0 08/01/2008   LDLCALC 121 (H) 01/28/2024   ALT 27 01/28/2024   AST 19 01/28/2024   NA 139 01/28/2024   K 4.2 01/28/2024   CL 100 01/28/2024   CREATININE 0.53 01/28/2024   BUN 15 01/28/2024   CO2 31 01/28/2024   TSH  6.38 (H) 01/28/2024   HGBA1C 5.8 01/28/2024   Assessment/Plan:  Darlene Lopez is a 52 y.o. White or Caucasian [1] female with  has a past medical history of Chicken pox, Headache, and Kidney stones.  Encounter for well adult exam with abnormal findings Age and sex appropriate education and counseling updated with regular exercise and diet Referrals for preventative services - for mammogram Immunizations addressed -  declines all Smoking counseling  - none needed Evidence for depression or other mood disorder - none significant Most recent labs reviewed. I have personally reviewed and have noted: 1) the patient's medical and social history 2) The patient's current medications and supplements 3) The patient's height, weight, and BMI have been recorded in the chart   Melanoma of skin [facility]) Needs new dermatology - for referral for ongoing observation  Migraine without aura and without status migrainosus, not intractable Mild to mod, for toradol  30 mg IM, fioricet prn, to f/u any worsening symptoms or concerns  Hyperglycemia Lab Results  Component Value Date   HGBA1C 5.8 01/28/2024   Stable, pt to continue current medical treatment  - diet, wt control   HLD (hyperlipidemia) Lab Results  Component Value Date   LDLCALC 121 (H) 01/28/2024   Mild uncontrolled, fior lower chol diet, decliens statin for now,   B12 deficiency Lab Results  Component Value Date   VITAMINB12 423 01/28/2024   Stable, cont oral replacement - b12 1000 mcg qd   Skin tags, multiple acquired Worsening, for derm referral  Other fatigue Also for thyroid  panel with labs, Mild to mod, to f/u any worsening symptoms or concerns  Menopausal symptoms Mild possible, for [facility] with labs  Hypersomnolence Mild to mod, for pulmonary referral, r/o osa,  to f/u any worsening symptoms or concerns  Arthritis of left knee Mild intermittent, for voltaren gel prn  Followup: Return in about 1 year (around 01/27/2025).  Lynwood Rush, MD 01/29/2024 7:04 PM [facility]

## 2024-01-29 ENCOUNTER — Other Ambulatory Visit: Payer: Self-pay | Admitting: Internal Medicine

## 2024-01-29 ENCOUNTER — Encounter: Payer: Self-pay | Admitting: Internal Medicine

## 2024-01-29 DIAGNOSIS — R5383 Other fatigue: Secondary | ICD-10-CM | POA: Insufficient documentation

## 2024-01-29 DIAGNOSIS — L918 Other hypertrophic disorders of the skin: Secondary | ICD-10-CM | POA: Insufficient documentation

## 2024-01-29 DIAGNOSIS — G4710 Hypersomnia, unspecified: Secondary | ICD-10-CM | POA: Insufficient documentation

## 2024-01-29 DIAGNOSIS — M1712 Unilateral primary osteoarthritis, left knee: Secondary | ICD-10-CM | POA: Insufficient documentation

## 2024-01-29 DIAGNOSIS — N951 Menopausal and female climacteric states: Secondary | ICD-10-CM | POA: Insufficient documentation

## 2024-01-29 LAB — THYROID PANEL WITH TSH
Free Thyroxine Index: 2.8 (ref 1.4–3.8)
T3 Uptake: 31 % (ref 22–35)
T4, Total: 8.9 ug/dL (ref 5.1–11.9)
TSH: 6.38 m[IU]/L — ABNORMAL HIGH

## 2024-01-29 MED ORDER — LEVOTHYROXINE SODIUM 25 MCG PO TABS: 25.0000 ug | ORAL_TABLET | Freq: Every day | ORAL | 3 refills | Status: AC

## 2024-01-29 NOTE — Assessment & Plan Note (Signed)
 Mild intermittent, for voltaren gel prn

## 2024-01-29 NOTE — Assessment & Plan Note (Signed)
 Age and sex appropriate education and counseling updated with regular exercise and diet Referrals for preventative services - for mammogram Immunizations addressed - declines all Smoking counseling  - none needed Evidence for depression or other mood disorder - none significant Most recent labs reviewed. I have personally reviewed and have noted: 1) the patient's medical and social history 2) The patient's current medications and supplements 3) The patient's height, weight, and BMI have been recorded in the chart

## 2024-01-29 NOTE — Assessment & Plan Note (Signed)
 Worsening, for derm referral

## 2024-01-29 NOTE — Assessment & Plan Note (Signed)
 Mild to mod, for toradol  30 mg IM, fioricet prn, to f/u any worsening symptoms or concerns

## 2024-01-29 NOTE — Assessment & Plan Note (Signed)
 Lab Results  Component Value Date   LDLCALC 121 (H) 01/28/2024   Mild uncontrolled, fior lower chol diet, decliens statin for now,

## 2024-01-29 NOTE — Assessment & Plan Note (Signed)
 Mild to mod, for pulmonary referral, r/o osa,  to f/u any worsening symptoms or concerns

## 2024-01-29 NOTE — Assessment & Plan Note (Signed)
 Mild possible, for [facility] with labs

## 2024-01-29 NOTE — Assessment & Plan Note (Signed)
 Lab Results  Component Value Date   VITAMINB12 423 01/28/2024   Stable, cont oral replacement - b12 1000 mcg qd

## 2024-01-29 NOTE — Assessment & Plan Note (Signed)
 Lab Results  Component Value Date   HGBA1C 5.8 01/28/2024   Stable, pt to continue current medical treatment  - diet, wt control

## 2024-01-29 NOTE — Assessment & Plan Note (Signed)
 Needs new dermatology - for referral for ongoing observation

## 2024-01-29 NOTE — Assessment & Plan Note (Signed)
 Also for thyroid  panel with labs, Mild to mod, to f/u any worsening symptoms or concerns

## 2024-02-18 ENCOUNTER — Ambulatory Visit
Admission: RE | Admit: 2024-02-18 | Discharge: 2024-02-18 | Disposition: A | Discharge status: Home / Self Care | Source: Ambulatory Visit | Attending: Internal Medicine | Admitting: Internal Medicine

## 2024-02-18 DIAGNOSIS — Z1231 Encounter for screening mammogram for malignant neoplasm of breast: Secondary | ICD-10-CM

## 2024-02-22 ENCOUNTER — Ambulatory Visit (INDEPENDENT_AMBULATORY_CARE_PROVIDER_SITE_OTHER): Admitting: Primary Care

## 2024-02-22 ENCOUNTER — Encounter: Payer: Self-pay | Admitting: Primary Care

## 2024-02-22 VITALS — BP 130/74 | HR 94 | Temp 97.6°F | Ht 63.0 in | Wt 272.4 lb

## 2024-02-22 DIAGNOSIS — J309 Allergic rhinitis, unspecified: Secondary | ICD-10-CM | POA: Diagnosis not present

## 2024-02-22 DIAGNOSIS — R0683 Snoring: Secondary | ICD-10-CM

## 2024-02-22 DIAGNOSIS — R062 Wheezing: Secondary | ICD-10-CM | POA: Diagnosis not present

## 2024-02-22 NOTE — Progress Notes (Deleted)
 "  @Patient  ID: Darlene Lopez, female    DOB: 1970/11/15, 53 y.o.   MRN: [id number]  Chief Complaint  Patient presents with   Consult    Snoring, witnessed apneas. Never diagnosed or tested. Does wake up gasping for air     Referring provider: Norleen Lynwood ORN, MD  HPI:       02/22/2024 Discussed the use of AI scribe software for clinical note transcription with the patient, who gave verbal consent to proceed.  History of Present Illness    Epworth score 12/24.   Allergies[1]  Immunization History  Administered Date(s) Administered   Influenza Split 03/02/2012   Td 02/23/2006   Tdap 10/08/2016    Past Medical History:  Diagnosis Date   Chicken pox    Headache    Migraines   Kidney stones     Tobacco History: Tobacco Use History[2] Counseling given: Not Answered   Outpatient Medications Prior to Visit  Medication Sig Dispense Refill   Acetaminophen  (TYLENOL  PO) Take 500 mg by mouth as needed.     ascorbic acid (VITAMIN C) 500 MG tablet Take 500 mg by mouth daily.     butalbital -acetaminophen -caffeine  (FIORICET) 50-325-40 MG tablet Take 1 tablet by mouth every 6 (six) hours as needed for headache. 14 tablet 1   Cholecalciferol (VITAMIN D -3 PO) Take 10,000 Int'l Units/day by mouth daily.     cyanocobalamin  (VITAMIN B12) 1000 MCG tablet Take 1,000 mcg by mouth daily.     ibuprofen (ADVIL) 200 MG tablet Take 200 mg by mouth every 6 (six) hours as needed for moderate pain (pain score 4-6).     levothyroxine  (SYNTHROID ) 25 MCG tablet Take 1 tablet (25 mcg total) by mouth daily. 90 tablet 3   Menaquinone-7 (VITAMIN K2 PO) Take 1 tablet by mouth daily.     Multiple Vitamin (MULTIVITAMIN) capsule Take 1 capsule by mouth daily.     ondansetron  (ZOFRAN -ODT) 4 MG disintegrating tablet Take 1 tablet (4 mg total) by mouth every 8 (eight) hours as needed for nausea or vomiting. 20 tablet 0   SUMAtriptan  (IMITREX ) 100 MG tablet Take 1 tablet (100 mg total) by mouth every  2 (two) hours as needed for migraine or headache. May repeat in 2 hours if headache persists or recurs. 10 tablet 5   tamsulosin  (FLOMAX ) 0.4 MG CAPS capsule Take 1 capsule (0.4 mg total) by mouth daily. 14 capsule 0   TURMERIC PO Take 1 capsule by mouth daily.     Zinc 50 MG TABS Take 1 tablet by mouth daily.     No facility-administered medications prior to visit.      Review of Systems  Review of Systems   Physical Exam  BP 130/74   Pulse 94   Temp 97.6 F (36.4 C)   Ht 5' 3 (1.6 m) Comment: pt stated  Wt 272 lb 6.4 oz (123.6 kg)   LMP 07/08/2023 (Approximate)   SpO2 98% Comment: ra  BMI 48.25 kg/m  Physical Exam  ***  Lab Results:  CBC    Component Value Date/Time   WBC 6.7 01/28/2024 1423   RBC 5.04 01/28/2024 1423   HGB 14.6 01/28/2024 1423   HCT 43.6 01/28/2024 1423   PLT 224.0 01/28/2024 1423   MCV 86.7 01/28/2024 1423   MCH 29.4 09/13/2019 1611   MCHC 33.5 01/28/2024 1423   RDW 14.3 01/28/2024 1423   LYMPHSABS 1.5 01/28/2024 1423   MONOABS 0.6 01/28/2024 1423   EOSABS 0.2 01/28/2024 1423  BASOSABS 0.0 01/28/2024 1423    BMET    Component Value Date/Time   NA 139 01/28/2024 1423   K 4.2 01/28/2024 1423   CL 100 01/28/2024 1423   CO2 31 01/28/2024 1423   GLUCOSE 99 01/28/2024 1423   BUN 15 01/28/2024 1423   CREATININE 0.53 01/28/2024 1423   CREATININE 0.63 09/13/2019 1611   CALCIUM  9.5 01/28/2024 1423   GFRNONAA 106 09/13/2019 1611   GFRAA 123 09/13/2019 1611    BNP No results found for: BNP  ProBNP No results found for: PROBNP  Imaging: No results found.   Assessment & Plan:   No problem-specific Assessment & Plan notes found for this encounter.   There are no diagnoses linked to this encounter.  Assessment and Plan Assessment & Plan       I personally spent a total of *** minutes in the care of the patient today including {Time Based Coding:210964241}.   Almarie LELON Ferrari, NP 02/22/2024    [1] No Known  Allergies [2]  Social History Tobacco Use  Smoking Status Never  Smokeless Tobacco Never   "

## 2024-02-22 NOTE — Progress Notes (Signed)
 "  @Patient  ID: Darlene Lopez, female    DOB: 09/14/1970, 53 y.o.   MRN: [id number]  Chief Complaint  Patient presents with   Consult    Snoring, witnessed apneas. Never diagnosed or tested. Does wake up gasping for air     Referring provider: Norleen Lynwood ORN, MD  HPI: 53 year old female, never smoked. PMH significant for allergic rhinitis, migraine headaches, GERD, hyperglycemia, melanoma, HLD, obesity.   02/22/2024 Discussed the use of AI scribe software for clinical note transcription with the patient, who gave verbal consent to proceed.  History of Present Illness Darlene Lopez is a 53 year old female who presents with concerns of sleep apnea. She was referred by her primary care physician, Dr. Norleen, for a sleep consultation.  She experiences symptoms of snoring, waking up choking or gasping for air, and observed pauses in breathing during sleep by her husband. She has restless sleep and daytime sleepiness, particularly in the afternoon, and drinks coffee in the morning to help with alertness. These sleep issues have been present for approximately two years, with worsening symptoms over the last several months.  Typical bedtime is between 10:30-11:00pm. Takes her 10 minutes to fall asleep. She wakes up multiple times a night. Starts her day at 7am.   She sleeps on her left side, which causes her left hand to fall asleep, leading to frequent awakenings. This has been a significant issue for her, as she prefers side sleeping.  Her father has a history of sleep apnea and uses a CPAP machine. She has not had a sleep study before.  She has recently been diagnosed with a thyroid  condition and started on a low dose of medication a couple of weeks ago.  She reports occasional wheezing at night, which resolves by morning, and attributes it possibly to a new dog in the house. She also experiences nasal congestion in her left nostril at night, which she describes as a 'flapping'  sensation, and has used Flonase over the summer to manage this.  She is motivated to address her weight, which she acknowledges as a contributing factor to her sleep issues.  No history of cardiac arrhythmias, COPD, asthma, oxygen use, or seizures.  Epworth score 12/24  Allergies[1]  Immunization History  Administered Date(s) Administered   Influenza Split 03/02/2012   Td 02/23/2006   Tdap 10/08/2016    Past Medical History:  Diagnosis Date   Chicken pox    Headache    Migraines   Kidney stones     Tobacco History: Tobacco Use History[2] Counseling given: Not Answered   Outpatient Medications Prior to Visit  Medication Sig Dispense Refill   Acetaminophen  (TYLENOL  PO) Take 500 mg by mouth as needed.     ascorbic acid (VITAMIN C) 500 MG tablet Take 500 mg by mouth daily.     butalbital -acetaminophen -caffeine  (FIORICET) 50-325-40 MG tablet Take 1 tablet by mouth every 6 (six) hours as needed for headache. 14 tablet 1   Cholecalciferol (VITAMIN D -3 PO) Take 10,000 Int'l Units/day by mouth daily.     cyanocobalamin  (VITAMIN B12) 1000 MCG tablet Take 1,000 mcg by mouth daily.     ibuprofen (ADVIL) 200 MG tablet Take 200 mg by mouth every 6 (six) hours as needed for moderate pain (pain score 4-6).     levothyroxine  (SYNTHROID ) 25 MCG tablet Take 1 tablet (25 mcg total) by mouth daily. 90 tablet 3   Menaquinone-7 (VITAMIN K2 PO) Take 1 tablet by mouth daily.  Multiple Vitamin (MULTIVITAMIN) capsule Take 1 capsule by mouth daily.     ondansetron  (ZOFRAN -ODT) 4 MG disintegrating tablet Take 1 tablet (4 mg total) by mouth every 8 (eight) hours as needed for nausea or vomiting. 20 tablet 0   SUMAtriptan  (IMITREX ) 100 MG tablet Take 1 tablet (100 mg total) by mouth every 2 (two) hours as needed for migraine or headache. May repeat in 2 hours if headache persists or recurs. 10 tablet 5   tamsulosin  (FLOMAX ) 0.4 MG CAPS capsule Take 1 capsule (0.4 mg total) by mouth daily. 14 capsule 0    TURMERIC PO Take 1 capsule by mouth daily.     Zinc 50 MG TABS Take 1 tablet by mouth daily.     No facility-administered medications prior to visit.    Review of Systems  Review of Systems  Constitutional:  Positive for fatigue.  Respiratory:  Positive for wheezing.   Psychiatric/Behavioral:  Positive for sleep disturbance.      Physical Exam  BP 130/74   Pulse 94   Temp 97.6 F (36.4 C)   Ht 5' 3 (1.6 m) Comment: pt stated  Wt 272 lb 6.4 oz (123.6 kg)   LMP 07/08/2023 (Approximate)   SpO2 98% Comment: ra  BMI 48.25 kg/m  Physical Exam Constitutional:      General: She is not in acute distress.    Appearance: Normal appearance. She is well-developed. She is obese. She is not ill-appearing.  HENT:     Head: Normocephalic and atraumatic.     Mouth/Throat:     Mouth: Mucous membranes are moist.     Pharynx: Oropharynx is clear.  Eyes:     Pupils: Pupils are equal, round, and reactive to light.  Cardiovascular:     Rate and Rhythm: Normal rate and regular rhythm.     Heart sounds: Normal heart sounds. No murmur heard. Pulmonary:     Effort: Pulmonary effort is normal. No respiratory distress.     Breath sounds: Normal breath sounds. No wheezing or rhonchi.     Comments: CTA Musculoskeletal:        General: Normal range of motion.     Cervical back: Normal range of motion and neck supple.  Skin:    General: Skin is warm and dry.     Findings: No erythema or rash.  Neurological:     General: No focal deficit present.     Mental Status: She is alert and oriented to person, place, and time. Mental status is at baseline.  Psychiatric:        Mood and Affect: Mood normal.        Behavior: Behavior normal.        Thought Content: Thought content normal.        Judgment: Judgment normal.    Lab Results:  CBC    Component Value Date/Time   WBC 6.7 01/28/2024 1423   RBC 5.04 01/28/2024 1423   HGB 14.6 01/28/2024 1423   HCT 43.6 01/28/2024 1423   PLT 224.0  01/28/2024 1423   MCV 86.7 01/28/2024 1423   MCH 29.4 09/13/2019 1611   MCHC 33.5 01/28/2024 1423   RDW 14.3 01/28/2024 1423   LYMPHSABS 1.5 01/28/2024 1423   MONOABS 0.6 01/28/2024 1423   EOSABS 0.2 01/28/2024 1423   BASOSABS 0.0 01/28/2024 1423    BMET    Component Value Date/Time   NA 139 01/28/2024 1423   K 4.2 01/28/2024 1423   CL 100 01/28/2024 1423  CO2 31 01/28/2024 1423   GLUCOSE 99 01/28/2024 1423   BUN 15 01/28/2024 1423   CREATININE 0.53 01/28/2024 1423   CREATININE 0.63 09/13/2019 1611   CALCIUM  9.5 01/28/2024 1423   GFRNONAA 106 09/13/2019 1611   GFRAA 123 09/13/2019 1611    BNP No results found for: BNP  ProBNP No results found for: PROBNP  Imaging: No results found.   Assessment & Plan:   1. Loud snoring (Primary) - Home sleep test; Future   Assessment and Plan Assessment & Plan Suspected obstructive sleep apnea Reports loud snoring, witnessed apneas, and daytime sleepiness. Family history of sleep apnea. Symptoms suggestive of obstructive sleep apnea, potentially mild to moderate. Discussed pathophysiology, including airway obstruction by the base of the tongue. Explained risks of untreated sleep apnea, including cardiac arrhythmias, stroke, diabetes, pulmonary hypertension, and potential links to Alzheimer's disease. Discussed treatment options: CPAP, oral appliances, and weight loss. Emphasized CPAP as the gold standard. Discussed potential for weight loss to improve or resolve sleep apnea. Explained home sleep study process and potential need for in-lab study if results are inconclusive. Discussed GLP-1 medications like Zepbound for moderate to severe cases, noting insurance coverage variability. - Ordered home sleep study through [facility]. - Advised follow-up via MyChart in 2-3 weeks after sleep study submission. - Will discuss potential treatment options, including CPAP and weight loss, based on sleep study results. - Will consider  GLP-1 medication (Zepbound) if moderate to severe sleep apnea is confirmed and insurance covers it.  Allergic rhinitis Reports nocturnal nasal congestion and sensation of obstruction in the left nostril, possibly due to inflammation. Discussed potential causes, including sinus inflammation and possible nodule. Recommended nasal spray for symptom relief. - Recommended Flonase nasal spray at bedtime for nasal congestion.  Nocturnal wheezing Reports nocturnal wheezing, possibly related to sleep apnea-induced upper airway swelling. Discussed potential improvement with CPAP therapy. Considered other causes such as allergies or asthma if wheezing persists despite CPAP use. - Will consider further evaluation with breathing test or allergy testing if wheezing persists after CPAP therapy.    Almarie LELON Ferrari, NP 02/22/2024     [1] No Known Allergies [2]  Social History Tobacco Use  Smoking Status Never  Smokeless Tobacco Never   "

## 2024-02-22 NOTE — Patient Instructions (Addendum)
" ° °  VISIT SUMMARY: You came in today to discuss your sleep issues, including snoring, waking up choking or gasping for air, and daytime sleepiness. We talked about your symptoms and family history, and I explained the potential risks and treatment options for sleep apnea. We also discussed your nasal congestion and occasional wheezing at night.  YOUR PLAN: -SUSPECTED OBSTRUCTIVE SLEEP APNEA: Obstructive sleep apnea is a condition where your airway becomes blocked during sleep, causing pauses in breathing. We discussed the risks of untreated sleep apnea and the treatment options, including CPAP, oral appliances, and weight loss. I have ordered a home sleep study for you through [facility]. Please follow up via MyChart in 2-3 weeks after submitting your sleep study results. Based on the results, we will discuss potential treatments, including CPAP and weight loss. If moderate to severe sleep apnea is confirmed and covered by insurance, we may consider GLP-1 medication like Zepbound. Look into Medcline pillow for side sleepers.   -NASAL AND SINUS SYMPTOMS: Your nasal congestion and sensation of obstruction in the left nostril may be due to inflammation. I recommend using Flonase nasal spray at bedtime to help relieve these symptoms.  -NOCTURNAL WHEEZING: Your wheezing at night might be related to sleep apnea-induced upper airway swelling. CPAP therapy may help improve this. If the wheezing persists despite CPAP use, we will consider further evaluation with a breathing test or allergy testing.  INSTRUCTIONS: Please complete the home sleep study through [facility] and follow up via MyChart in 2-3 weeks after submitting your sleep study results. Use Flonase nasal spray at bedtime for nasal congestion. If your wheezing persists after starting CPAP therapy, we may need to do further tests.   "

## 2024-02-29 ENCOUNTER — Encounter: Payer: Self-pay | Admitting: Internal Medicine

## 2024-02-29 ENCOUNTER — Ambulatory Visit: Payer: Self-pay | Admitting: Internal Medicine

## 2024-02-29 ENCOUNTER — Ambulatory Visit: Admitting: Internal Medicine

## 2024-02-29 VITALS — BP 122/80 | HR 80 | Temp 98.6°F | Ht 63.0 in | Wt 271.0 lb

## 2024-02-29 DIAGNOSIS — N951 Menopausal and female climacteric states: Secondary | ICD-10-CM | POA: Diagnosis not present

## 2024-02-29 DIAGNOSIS — E7800 Pure hypercholesterolemia, unspecified: Secondary | ICD-10-CM | POA: Diagnosis not present

## 2024-02-29 DIAGNOSIS — E538 Deficiency of other specified B group vitamins: Secondary | ICD-10-CM | POA: Diagnosis not present

## 2024-02-29 DIAGNOSIS — R739 Hyperglycemia, unspecified: Secondary | ICD-10-CM | POA: Diagnosis not present

## 2024-02-29 DIAGNOSIS — E038 Other specified hypothyroidism: Secondary | ICD-10-CM | POA: Diagnosis not present

## 2024-02-29 LAB — TESTOSTERONE: Testosterone: 52.82 ng/dL — ABNORMAL HIGH (ref 15.00–40.00)

## 2024-02-29 LAB — THYROID PANEL WITH TSH
Free Thyroxine Index: 2.9 (ref 1.4–3.8)
T3 Uptake: 30 % (ref 22–35)
T4, Total: 9.5 ug/dL (ref 5.1–11.9)
TSH: 2.82 m[IU]/L

## 2024-02-29 LAB — PROLACTIN: Prolactin: 8.5 ng/mL

## 2024-02-29 LAB — ESTRADIOL: Estradiol: 67 pg/mL

## 2024-02-29 LAB — PROGESTERONE: Progesterone: <0.5 ng/mL

## 2024-02-29 NOTE — Assessment & Plan Note (Signed)
 Lab Results  Component Value Date   HGBA1C 5.8 01/28/2024   Stable, pt to continue current medical treatment  - diet, wt control

## 2024-02-29 NOTE — Progress Notes (Signed)
 Patient ID: Darlene Lopez, female   DOB: 09/21/1970, 54 y.o.   MRN: [id number]        Chief Complaint: follow up subclinical hypothyroidism, hyperglycemia, hld, low b12       HPI:  Darlene Lopez is a 54 y.o. female here overall doing well.  Pt denies chest pain, increased sob or doe, wheezing, orthopnea, PND, increased LE swelling, palpitations, dizziness or syncope.   Pt denies polydipsia, polyuria, or new focal neuro s/s.    Pt denies fever, wt loss, night sweats, loss of appetite, or other constitutional symptoms  Denies hyper or hypo thyroid  symptoms such as voice, skin or hair change.  Denies worsening depressive symptoms, suicidal ideation, or panic.  Does have what she feels are perimenopausal symptoms associated with irregular menses.  Recent FSH normal, but she is asking for other homonal levels.  Also incidentally has a small cyst occurred to the post left 5th finger DIP and somewhat tender, but not ready for hand surgury referral, does have f/u with Dermatology soon.  Pt also seen per Pulmonary and plans to complete home sleep study soon.         Wt Readings from Last 3 Encounters:  02/29/24 271 lb (122.9 kg)  02/22/24 272 lb 6.4 oz (123.6 kg)  01/28/24 268 lb (121.6 kg)   BP Readings from Last 3 Encounters:  02/29/24 122/80  02/22/24 130/74  01/28/24 (!) 140/80         Past Medical History:  Diagnosis Date   Chicken pox    Headache    Migraines   Kidney stones    Past Surgical History:  Procedure Laterality Date   EXTRACORPOREAL SHOCK WAVE LITHOTRIPSY Left 01/25/2023   Procedure: EXTRACORPOREAL SHOCK WAVE LITHOTRIPSY (ESWL);  Surgeon: Darlene Adine PARAS., MD;  Location: [facility];  Service: Urology;  Laterality: Left;   KIDNEY STONE SURGERY     w stent, age 75   melanoma removal      reports that she has never smoked. She has never used smokeless tobacco. She reports that she does not drink alcohol and does not use drugs. family history includes  Lung cancer in her paternal grandmother; Prostate cancer in her father. Allergies[1] Medications Ordered Prior to Encounter[2]      ROS:  All others reviewed and negative.  Objective        PE:  BP 122/80 (BP Location: Right Arm, Patient Position: Sitting, Cuff Size: Normal)   Pulse 80   Temp 98.6 F (37 C) (Oral)   Ht 5' 3 (1.6 m)   Wt 271 lb (122.9 kg)   LMP  (LMP Unknown)   SpO2 96%   BMI 48.01 kg/m                 Constitutional: Pt appears in NAD               HENT: Head: NCAT.                Right Ear: External ear normal.                 Left Ear: External ear normal.                Eyes: . Pupils are equal, round, and reactive to light. Conjunctivae and EOM are normal               Nose: without d/c or deformity  Neck: Neck supple. Gross normal ROM               Cardiovascular: Normal rate and regular rhythm.                 Pulmonary/Chest: Effort normal and breath sounds without rales or wheezing.                Abd:  Soft, NT, ND, + BS, no organomegaly               Neurological: Pt is alert. At baseline orientation, motor grossly intact               Skin: Skin is warm. No rashes, no other new lesions, LE edema - none               Psychiatric: Pt behavior is normal without agitation   Micro: none  Cardiac tracings I have personally interpreted today:  none  Pertinent Radiological findings (summarize): none   Lab Results  Component Value Date   WBC 6.7 01/28/2024   HGB 14.6 01/28/2024   HCT 43.6 01/28/2024   PLT 224.0 01/28/2024   GLUCOSE 99 01/28/2024   CHOL 193 01/28/2024   TRIG 119.0 01/28/2024   HDL 48.00 01/28/2024   LDLDIRECT 150.0 08/01/2008   LDLCALC 121 (H) 01/28/2024   ALT 27 01/28/2024   AST 19 01/28/2024   NA 139 01/28/2024   K 4.2 01/28/2024   CL 100 01/28/2024   CREATININE 0.53 01/28/2024   BUN 15 01/28/2024   CO2 31 01/28/2024   TSH 6.38 (H) 01/28/2024   HGBA1C 5.8 01/28/2024   Assessment/Plan:  Darlene Lopez  is a 54 y.o. White or Caucasian [1] female with  has a past medical history of Chicken pox, Headache, and Kidney stones.  Subclinical hypothyroidism Lab Results  Component Value Date   TSH 6.38 (H) 01/28/2024   Mild asymptomatic, now on levothyroxine  25 mcg per day  - for f/u lab today   Hyperglycemia Lab Results  Component Value Date   HGBA1C 5.8 01/28/2024   Stable, pt to continue current medical treatment  - diet, wt control   HLD (hyperlipidemia) Lab Results  Component Value Date   LDLCALC 121 (H) 01/28/2024   Uncontrolled has been working on lower chol diet, pt to continue and f/u lab   B12 deficiency Lab Results  Component Value Date   VITAMINB12 423 01/28/2024   Stable, cont oral replacement - b12 1000 mcg qd   Peri-menopause Also for hormone levels as ordered -  Followup: Return in about 6 months (around 08/28/2024).  Darlene Rush, MD 02/29/2024 1:14 PM [facility]     [1] No Known Allergies [2]  Current Outpatient Medications on File Prior to Visit  Medication Sig Dispense Refill   Acetaminophen  (TYLENOL  PO) Take 500 mg by mouth as needed.     ascorbic acid (VITAMIN C) 500 MG tablet Take 500 mg by mouth daily.     butalbital -acetaminophen -caffeine  (FIORICET) 50-325-40 MG tablet Take 1 tablet by mouth every 6 (six) hours as needed for headache. 14 tablet 1   Cholecalciferol (VITAMIN D -3 PO) Take 10,000 Int'l Units/day by mouth daily.     cyanocobalamin  (VITAMIN B12) 1000 MCG tablet Take 1,000 mcg by mouth daily.     ibuprofen (ADVIL) 200 MG tablet Take 200 mg by mouth every 6 (six) hours as needed for moderate pain (pain score 4-6).  levothyroxine  (SYNTHROID ) 25 MCG tablet Take 1 tablet (25 mcg total) by mouth daily. 90 tablet 3   Menaquinone-7 (VITAMIN K2 PO) Take 1 tablet by mouth daily.     Multiple Vitamin (MULTIVITAMIN) capsule Take 1 capsule by mouth daily.     ondansetron   (ZOFRAN -ODT) 4 MG disintegrating tablet Take 1 tablet (4 mg total) by mouth every 8 (eight) hours as needed for nausea or vomiting. 20 tablet 0   SUMAtriptan  (IMITREX ) 100 MG tablet Take 1 tablet (100 mg total) by mouth every 2 (two) hours as needed for migraine or headache. May repeat in 2 hours if headache persists or recurs. 10 tablet 5   tamsulosin  (FLOMAX ) 0.4 MG CAPS capsule Take 1 capsule (0.4 mg total) by mouth daily. 14 capsule 0   TURMERIC PO Take 1 capsule by mouth daily.     Zinc 50 MG TABS Take 1 tablet by mouth daily.     No current facility-administered medications on file prior to visit.

## 2024-02-29 NOTE — Patient Instructions (Signed)
 Please continue all other medications as before, and refills have been done if requested.  Please have the pharmacy call with any other refills you may need.  Please continue your efforts at being more active, low cholesterol diet, and weight control.  You are otherwise up to date with prevention measures today.  Please keep your appointments with your specialists as you may have planned  Good luck with your home sleep study  Let us  know if you would want the Hand Surgury referral  Please go to the LAB at the blood drawing area for the tests to be done  You will be contacted by phone if any changes need to be made immediately.  Otherwise, you will receive a letter about your results with an explanation, but please check with MyChart first.  Please make an Appointment to return in 6 months, or sooner if needed

## 2024-02-29 NOTE — Assessment & Plan Note (Signed)
 Also for hormone levels as ordered -

## 2024-02-29 NOTE — Assessment & Plan Note (Signed)
 Lab Results  Component Value Date   TSH 6.38 (H) 01/28/2024   Mild asymptomatic, now on levothyroxine  25 mcg per day  - for f/u lab today

## 2024-02-29 NOTE — Assessment & Plan Note (Signed)
 Lab Results  Component Value Date   VITAMINB12 423 01/28/2024   Stable, cont oral replacement - b12 1000 mcg qd

## 2024-02-29 NOTE — Assessment & Plan Note (Signed)
 Lab Results  Component Value Date   LDLCALC 121 (H) 01/28/2024   Uncontrolled has been working on lower chol diet, pt to continue and f/u lab

## 2024-03-01 ENCOUNTER — Encounter: Payer: Self-pay | Admitting: Physician Assistant

## 2024-03-01 ENCOUNTER — Ambulatory Visit (INDEPENDENT_AMBULATORY_CARE_PROVIDER_SITE_OTHER): Admitting: Physician Assistant

## 2024-03-01 DIAGNOSIS — Z85828 Personal history of other malignant neoplasm of skin: Secondary | ICD-10-CM | POA: Diagnosis not present

## 2024-03-01 DIAGNOSIS — M67442 Ganglion, left hand: Secondary | ICD-10-CM | POA: Diagnosis not present

## 2024-03-01 DIAGNOSIS — Z85820 Personal history of malignant melanoma of skin: Secondary | ICD-10-CM

## 2024-03-01 DIAGNOSIS — D2372 Other benign neoplasm of skin of left lower limb, including hip: Secondary | ICD-10-CM | POA: Diagnosis not present

## 2024-03-01 DIAGNOSIS — D239 Other benign neoplasm of skin, unspecified: Secondary | ICD-10-CM

## 2024-03-01 DIAGNOSIS — M67449 Ganglion, unspecified hand: Secondary | ICD-10-CM

## 2024-03-01 NOTE — Progress Notes (Signed)
" ° °  New Patient Visit   Subjective  Darlene Lopez is a 54 y.o. female NEW PATIENT who presents for the following: Spot of left ring finger that came ~2 weeks ago. It is tender and sore.  History of Melanoma (left thigh 2001) - excised with sentinel lymph node biopsy which was clear. History of BCC of right shoulder. She declines TBSE today, will plan on follow up.  The following portions of the chart were reviewed this encounter and updated as appropriate: medications, allergies, medical history  Review of Systems:  No other skin or systemic complaints except as noted in HPI or Assessment and Plan.  Objective  Well appearing patient in no apparent distress; mood and affect are within normal limits.   A focused examination was performed of the following areas: Left hand   Relevant exam findings are noted in the Assessment and Plan.  Left Distal 4th Finger Flesh colored papule  Assessment & Plan   HISTORY OF BASAL CELL CARCINOMA OF THE SKIN - rec full body skin exam soon  HISTORY OF MELANOMA --- LEFT THIGH  - rec full body skin exam soon   DERMATOFIBROMA Exam: Firm pink/brown papulenodule with dimple sign left ankle.  Treatment Plan: A dermatofibroma is a benign growth possibly related to trauma, such as an insect bite, cut from shaving, or inflamed acne-type bump.  Treatment options to remove include shave or excision with resulting scar and risk of recurrence.  Since benign-appearing and not bothersome, will observe for now.    No charge for I & D of digital mucinous cyst (below).  DIGITAL MUCOUS CYST Left Distal 4th Finger We will send a referral to [facility]. - Incision and Drainage - Left Distal 4th Finger Location: Left 4th finger  Informed Consent: Discussed risks (permanent scarring, light or dark discoloration, infection, pain, bleeding, bruising, redness, damage to adjacent structures, and recurrence of the lesion) and benefits of the procedure, as well as the  alternatives.  Informed consent was obtained.  Preparation: The area was prepped with alcohol.  Anesthesia: Lidocaine 1% with epinephrine  Procedure Details: An incision was made overlying the lesion. The lesion drained clear, mucoid fluid.  A small amount of fluid was drained.    Antibiotic ointment and a sterile pressure dressing were applied. The patient tolerated procedure well.  Total number of lesions drained: 1  Plan: The patient was instructed on post-op care. Recommend OTC analgesia as needed for pain.   This Visit - Ambulatory referral to Orthopedic Surgery HISTORY OF MALIGNANT MELANOMA OF SKIN   HISTORY OF BASAL CELL CARCINOMA   DERMATOFIBROMA    Return for first available for FBSE with [facility].  I, Roseline Hutchinson, CMA, am acting as scribe for Tramon Crescenzo K, PA-C .   Documentation: I have reviewed the above documentation for accuracy and completeness, and I agree with the above.  Sheng Pritz K, PA-C    "

## 2024-03-01 NOTE — Patient Instructions (Signed)

## 2024-03-03 ENCOUNTER — Ambulatory Visit (INDEPENDENT_AMBULATORY_CARE_PROVIDER_SITE_OTHER): Admitting: Internal Medicine

## 2024-03-03 ENCOUNTER — Encounter: Payer: Self-pay | Admitting: Internal Medicine

## 2024-03-03 VITALS — BP 134/80 | HR 95 | Temp 98.5°F | Ht 63.0 in

## 2024-03-03 DIAGNOSIS — R7989 Other specified abnormal findings of blood chemistry: Secondary | ICD-10-CM | POA: Diagnosis not present

## 2024-03-03 DIAGNOSIS — E038 Other specified hypothyroidism: Secondary | ICD-10-CM | POA: Diagnosis not present

## 2024-03-03 DIAGNOSIS — E538 Deficiency of other specified B group vitamins: Secondary | ICD-10-CM

## 2024-03-03 MED ORDER — PROGESTERONE MICRONIZED 100 MG PO CAPS: 100.0000 mg | ORAL_CAPSULE | Freq: Every day | ORAL | 5 refills | Status: AC

## 2024-03-03 NOTE — Progress Notes (Unsigned)
 Patient ID: Darlene Lopez, female   DOB: [birth year], 54 y.o.   MRN: [id number]        Chief Complaint: follow up hormonal labs       HPI:  Darlene Lopez is a 54 y.o. female here with c/o several symptoms such as difficulty staying asleep, hot flashes, myalgias all over that wake her up at night mostly to upper arms and legs, moody, low libido and overall lack of interest and motivation.  Did have recent low progesterone .  Also with slightly higher testosterone  level in the setting of morbid obesity.   Denies hyper or hypo thyroid  symptoms such as voice, skin or hair change. Pt denies chest pain, increased sob or doe, wheezing, orthopnea, PND, increased LE swelling, palpitations, dizziness or syncope.   Pt denies polydipsia, polyuria, or new focal neuro s/s.          Wt Readings from Last 3 Encounters:  02/29/24 271 lb (122.9 kg)  02/22/24 272 lb 6.4 oz (123.6 kg)  01/28/24 268 lb (121.6 kg)   BP Readings from Last 3 Encounters:  03/03/24 134/80  02/29/24 122/80  02/22/24 130/74         Past Medical History:  Diagnosis Date   Chicken pox    Headache    Migraines   Kidney stones    Past Surgical History:  Procedure Laterality Date   EXTRACORPOREAL SHOCK WAVE LITHOTRIPSY Left 01/25/2023   Procedure: EXTRACORPOREAL SHOCK WAVE LITHOTRIPSY (ESWL);  Surgeon: Roseann Adine PARAS., MD;  Location: [facility];  Service: Urology;  Laterality: Left;   KIDNEY STONE SURGERY     w stent, age 21   melanoma removal      reports that she has never smoked. She has never used smokeless tobacco. She reports that she does not drink alcohol and does not use drugs. family history includes Lung cancer in her paternal grandmother; Prostate cancer in her father. Allergies[1] Medications Ordered Prior to Encounter[2]      ROS:  All others reviewed and negative.  Objective        PE:  BP 134/80 (BP Location: Right Arm, Patient Position: Sitting, Cuff Size: Normal)   Pulse 95    Temp 98.5 F (36.9 C) (Oral)   Ht 5' 3 (1.6 m)   LMP  (LMP Unknown)   SpO2 97%   BMI 48.01 kg/m                 Constitutional: Pt appears in NAD               HENT: Head: NCAT.                Right Ear: External ear normal.                 Left Ear: External ear normal.                Eyes: . Pupils are equal, round, and reactive to light. Conjunctivae and EOM are normal               Nose: without d/c or deformity               Neck: Neck supple. Gross normal ROM               Cardiovascular: Normal rate and regular rhythm.                 Pulmonary/Chest: Effort normal and breath sounds without rales  or wheezing.                Abd:  Soft, NT, ND, + BS, no organomegaly               Neurological: Pt is alert. At baseline orientation, motor grossly intact               Skin: Skin is warm. No rashes, no other new lesions, LE edema - none               Psychiatric: Pt behavior is normal without agitation   Micro: none  Cardiac tracings I have personally interpreted today:  none  Pertinent Radiological findings (summarize): none   Lab Results  Component Value Date   WBC 6.7 01/28/2024   HGB 14.6 01/28/2024   HCT 43.6 01/28/2024   PLT 224.0 01/28/2024   GLUCOSE 99 01/28/2024   CHOL 193 01/28/2024   TRIG 119.0 01/28/2024   HDL 48.00 01/28/2024   LDLDIRECT 150.0 08/01/2008   LDLCALC 121 (H) 01/28/2024   ALT 27 01/28/2024   AST 19 01/28/2024   NA 139 01/28/2024   K 4.2 01/28/2024   CL 100 01/28/2024   CREATININE 0.53 01/28/2024   BUN 15 01/28/2024   CO2 31 01/28/2024   TSH 2.82 02/29/2024   HGBA1C 5.8 01/28/2024   Assessment/Plan:  Darlene Lopez is a 54 y.o. White or Caucasian [1] female with  has a past medical history of Chicken pox, Headache, and Kidney stones.  Low serum progesterone  Ok for low dose progesterone  asd, pt asks for endo referral  Subclinical hypothyroidism Lab Results  Component Value Date   TSH 2.82 02/29/2024   Stable, pt to continue  levothyroxine  25 mcg qd  Followup: Return if symptoms worsen or fail to improve.  Lynwood Rush, MD 03/04/2024 2:28 PM [facility]     [1] No Known Allergies [2]  Current Outpatient Medications on File Prior to Visit  Medication Sig Dispense Refill   Acetaminophen  (TYLENOL  PO) Take 500 mg by mouth as needed.     ascorbic acid (VITAMIN C) 500 MG tablet Take 500 mg by mouth daily.     butalbital -acetaminophen -caffeine  (FIORICET) 50-325-40 MG tablet Take 1 tablet by mouth every 6 (six) hours as needed for headache. 14 tablet 1   Cholecalciferol (VITAMIN D -3 PO) Take 10,000 Int'l Units/day by mouth daily.     cyanocobalamin  (VITAMIN B12) 1000 MCG tablet Take 1,000 mcg by mouth daily.     ibuprofen (ADVIL) 200 MG tablet Take 200 mg by mouth every 6 (six) hours as needed for moderate pain (pain score 4-6).     levothyroxine  (SYNTHROID ) 25 MCG tablet Take 1 tablet (25 mcg total) by mouth daily. 90 tablet 3   Menaquinone-7 (VITAMIN K2 PO) Take 1 tablet by mouth daily.     Multiple Vitamin (MULTIVITAMIN) capsule Take 1 capsule by mouth daily.     ondansetron  (ZOFRAN -ODT) 4 MG disintegrating tablet Take 1 tablet (4 mg total) by mouth every 8 (eight) hours as needed for nausea or vomiting. 20 tablet 0   SUMAtriptan  (IMITREX ) 100 MG tablet Take 1 tablet (100 mg total) by mouth every 2 (two) hours as needed for migraine or headache. May repeat in 2 hours if headache persists or recurs. 10 tablet 5   tamsulosin  (FLOMAX ) 0.4 MG CAPS capsule Take 1 capsule (0.4 mg total) by mouth daily. 14 capsule 0   TURMERIC PO Take  1 capsule by mouth daily.     Zinc 50 MG TABS Take 1 tablet by mouth daily.     No current facility-administered medications on file prior to visit.

## 2024-03-03 NOTE — Patient Instructions (Signed)
 Please take all new medication as prescribed - the progesterone   Please continue all other medications as before.  Please have the pharmacy call with any other refills you may need.  Please continue your efforts at being more active, low cholesterol diet, and weight control.  Please keep your appointments with your specialists as you may have planned - your new GYN  You will be contacted regarding the referral for: Endocrinology

## 2024-03-04 ENCOUNTER — Encounter: Payer: Self-pay | Admitting: Internal Medicine

## 2024-03-04 NOTE — Assessment & Plan Note (Addendum)
 Ok for low dose progesterone  asd, pt asks for endo referral

## 2024-03-04 NOTE — Assessment & Plan Note (Signed)
 Lab Results  Component Value Date   TSH 2.82 02/29/2024   Stable, pt to continue levothyroxine  25 mcg qd

## 2024-03-20 ENCOUNTER — Ambulatory Visit: Admitting: Orthopedic Surgery

## 2024-03-21 ENCOUNTER — Ambulatory Visit: Admitting: Primary Care

## 2024-04-05 ENCOUNTER — Ambulatory Visit: Admitting: Orthopedic Surgery

## 2024-04-25 ENCOUNTER — Ambulatory Visit: Admitting: Family Medicine

## 2024-06-28 ENCOUNTER — Ambulatory Visit: Admitting: Physician Assistant

## 2024-09-19 ENCOUNTER — Ambulatory Visit: Admitting: Physician Assistant
